# Patient Record
Sex: Female | Born: 1975 | Race: White | Hispanic: No | Marital: Married | Smoking: Never smoker
Health system: Southern US, Community
[De-identification: ages and names within clinical notes are randomized; demographics above are authoritative.]

## PROBLEM LIST (undated history)

## (undated) DIAGNOSIS — T4145XA Adverse effect of unspecified anesthetic, initial encounter: Secondary | ICD-10-CM

## (undated) DIAGNOSIS — Z8742 Personal history of other diseases of the female genital tract: Secondary | ICD-10-CM

## (undated) DIAGNOSIS — J329 Chronic sinusitis, unspecified: Secondary | ICD-10-CM

## (undated) DIAGNOSIS — Z87442 Personal history of urinary calculi: Secondary | ICD-10-CM

## (undated) DIAGNOSIS — T8859XA Other complications of anesthesia, initial encounter: Secondary | ICD-10-CM

## (undated) DIAGNOSIS — O24419 Gestational diabetes mellitus in pregnancy, unspecified control: Secondary | ICD-10-CM

## (undated) DIAGNOSIS — O09519 Supervision of elderly primigravida, unspecified trimester: Secondary | ICD-10-CM

## (undated) DIAGNOSIS — R519 Headache, unspecified: Secondary | ICD-10-CM

## (undated) DIAGNOSIS — R51 Headache: Secondary | ICD-10-CM

## (undated) DIAGNOSIS — R0981 Nasal congestion: Secondary | ICD-10-CM

## (undated) DIAGNOSIS — M2241 Chondromalacia patellae, right knee: Secondary | ICD-10-CM

## (undated) HISTORY — DX: Headache: R51

## (undated) HISTORY — DX: Supervision of elderly primigravida, unspecified trimester: O09.519

## (undated) HISTORY — DX: Personal history of urinary calculi: Z87.442

## (undated) HISTORY — DX: Personal history of other diseases of the female genital tract: Z87.42

## (undated) HISTORY — DX: Headache, unspecified: R51.9

---
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
## 2006-07-05 HISTORY — PX: SKIN GRAFT SPLIT THICKNESS LEG / FOOT: SUR1303

## 2008-06-08 ENCOUNTER — Emergency Department (HOSPITAL_COMMUNITY): Admission: EM | Admit: 2008-06-08 | Discharge: 2008-06-08 | Payer: Self-pay | Admitting: Family Medicine

## 2011-07-06 DIAGNOSIS — M2241 Chondromalacia patellae, right knee: Secondary | ICD-10-CM

## 2011-07-06 HISTORY — DX: Chondromalacia patellae, right knee: M22.41

## 2011-07-16 ENCOUNTER — Encounter (HOSPITAL_BASED_OUTPATIENT_CLINIC_OR_DEPARTMENT_OTHER): Payer: Self-pay | Admitting: *Deleted

## 2011-07-16 DIAGNOSIS — J329 Chronic sinusitis, unspecified: Secondary | ICD-10-CM

## 2011-07-16 DIAGNOSIS — R0981 Nasal congestion: Secondary | ICD-10-CM

## 2011-07-16 HISTORY — DX: Chronic sinusitis, unspecified: J32.9

## 2011-07-16 HISTORY — DX: Nasal congestion: R09.81

## 2011-07-19 NOTE — H&P (Signed)
Kimberly [name]/[facility] [address]   [address] [place], [place] [address] [phone] A Division of [facility]  Loreta Ave, M.D.     Robert A. Thurston Hole, M.D.     Lunette Stands, M.D. Eulas Post, M.D.    Buford Dresser, M.D. Estell Harpin, M.D. Ralene Cork, D.O.          Genene Churn. Barry Dienes, PA-C            Kirstin A. Shepperson, PA-C [place], OPA-C   RE: Kimberly, [name]                                [phone]      DOB: 1975-08-13 PROGRESS NOTE: 02-25-11 [name] is a pleasant 36 year-old female patient who comes in today complaining of right knee pain for the last two months.  She is a runner. She runs 1-2 miles anywhere from 2-5 times per week.  She denies any injury to her right knee.  She noticed that for the last two months she developed gradually lateral knee pain.  The pain has worsened in the last three weeks and becoming constant.  The pain is 3/10 in intensity.  Located in the lateral aspect of her knee.  No numbness or tingling radiating distally.  The pain gets worse with weight bearing activities and running.  Improved by resting.  She has tried some Motrin which has helped some, but hasn't taken complete care of her symptoms.  She never had pain like this before in her knee.  She never had an injury in her knees.  She did not change her training or increase her mileage.  Past medical history: Not significant. Current medications: None. Allergies: Hydrocodone. Family history: Negative. Social history: Does not smoke.  She works in [employer].     EXAMINATION: Well developed well nourished in no acute distress. Eyes: extraocular motion is intact. Lymph: no cervical or axillary lymph nodes present. Cardiovascular: no pedal edema. Respiratory: no increase in respiratory effort.  Psych: judgment and insight intact. Skin: No rashes or lesions Neuro: sensation is intact.  Musculoskeletal: Right knee with  intact skin.  No swelling.  No hematomas.  Full range of motion of flexion and extension.  Gait without a limp.  There is tenderness to palpation on the lateral retinaculum area lateral to the patellar tendon.  There is mild tenderness over the distal IT band.  Ober test is negative for IT band pain.  There is bilateral weakness of the abductor muscles in her hip.  Gait is without a limp.  Ligaments intact.  There is no patellofemoral crepitus.    X-RAYS: AP, lateral and sunrise views of her right knee is within normal limits.  No fractures.  No dislocations.  No calcification.    IMPRESSION: 1. Right knee pain. 2. Right knee iliotibial band syndrome.  3. Weakness of the hip abductors.    PLAN: We will start her on Mobic 15 mg p.o. daily.  We will refer her to physical therapy and recommend she receive iontophoresis.  She has worked with Vonna Kotyk in the past and she would like to work with him again.  We recommend she continue icing her knee.  We recommend she stops running until her symptoms have improved.  Physical therapy has a return to running program that we would like her to be involved with.  She will  contact us in 3-4 weeks.  If her symptoms are not improved by then we will consider proceeding with an MRI of her knee to rule out any soft tissue injury.    Nat Math, D.O.   Electronically verified by Nat Math, D.O. TRD(JR):jjh D 02-25-11 T 03-01-11  Yvetta Drotar/[facility] [address]   [address] [place], [place] [address] [phone] A Division of [facility]  Loreta Ave, M.D.     Robert A. Thurston Hole, M.D.     Lunette Stands, M.D. Eulas Post, M.D.    Buford Dresser, M.D. Estell Harpin, M.D. Ralene Cork, D.O.          Genene Churn. Barry Dienes, PA-C            Kirstin A. Shepperson, PA-C [facility], OPA-C   RE: Kimberly, [name]                                [phone]      DOB: Mar 19, 1976 PROGRESS  NOTE: 06-08-11 I had Kimberly [name] come in today to go over the dilemma with her knee, review her x-rays, her MRI scan, her symptoms and all possible treatment options.  She has now very longstanding lateral and anterior knee pain on the right.  This has been going off and on for at least six months.  Getting worse rather than better.  She is a very active runner.  Gradual onset of pain.  Not bad when she goes straight ahead, but any uneven ground, pivoting, squatting or activities that load the front of her knee cause significant symptoms.  She has been through a trial of rest, physical therapy and a course of anti-inflammatories without improvement.  She is getting worse rather than better.  Recent MRI complete.  I have gone over that report and scan and discussed it with her and her husband, who is with her.  She has very typical lateral patellofemoral compression with significant chondral changes lateral patella and even some subchondral changes there.  Lateral tilt.  Remaining structures of the knee look good.  No cartilage tears.  Ligaments intact.  Her symptoms have been very insidious in onsent.  I have also reviewed her x-rays that show nothing acute, but she does have lateral patellofemoral positioning.  Remaining history is reviewed, updated and included in the chart.      EXAMINATION: She has good motion of all joints both lower extremities.  Her Q angles are not excessive on either side, but on the right she has lateral tracking and much more lateral tethering than on the left.  Pain with patellar compression.  1-2+ crepitus, not extreme.  Ligaments stable.  Neurovascularly intact.  No meniscal signs.  Her opposite left knee does not have nearly as much tethering and certainly no pain with patellar compression.  Also her lateral tracking is more dramatic on the right than on the left.  Her VMO and quads really don't look bad and there is not a lot of atrophy.    DISPOSITION:  I have gone over her  diagnosis and treatment options with her in detail.  She and I both realize this is not improving on its own.  I don't think further efforts at conservative treatment, therapy, injection or medicines are going to help and she understands and agrees.  We discussed exam under anesthesia, arthroscopy, chondroplasty.  My hope is that  we can do a lateral release which will improve lateral compression and change the way her knee works so we can get much more long-term improvement.  Obviously I am not going to do a lateral release if she is not tethering.  We have talked a little bit about F[place]erson procedures as a backup plan in the future.  Given that her Q angle is not bad, I really doubt it is going to come to that.  All questions answered.  Procedures, risks, benefits and complications reviewed in detail.  Cautious outlook, but I really do feel this is the best approach to take.    Loreta Ave, M.D.   Electronically verified by Loreta Ave, M.D. DFM:jjh D 06-09-11 T 06-10-11

## 2011-07-22 ENCOUNTER — Ambulatory Visit (HOSPITAL_BASED_OUTPATIENT_CLINIC_OR_DEPARTMENT_OTHER)
Admission: RE | Admit: 2011-07-22 | Discharge: 2011-07-22 | Disposition: A | Discharge status: Home / Self Care | Payer: BC Managed Care – PPO | Source: Ambulatory Visit | Attending: Orthopedic Surgery | Admitting: Orthopedic Surgery

## 2011-07-22 ENCOUNTER — Encounter (HOSPITAL_BASED_OUTPATIENT_CLINIC_OR_DEPARTMENT_OTHER)
Admission: RE | Disposition: A | Discharge status: Home / Self Care | Payer: Self-pay | Source: Ambulatory Visit | Attending: Orthopedic Surgery

## 2011-07-22 ENCOUNTER — Encounter (HOSPITAL_BASED_OUTPATIENT_CLINIC_OR_DEPARTMENT_OTHER): Payer: Self-pay | Admitting: Anesthesiology

## 2011-07-22 ENCOUNTER — Ambulatory Visit (HOSPITAL_BASED_OUTPATIENT_CLINIC_OR_DEPARTMENT_OTHER): Payer: BC Managed Care – PPO | Admitting: Anesthesiology

## 2011-07-22 ENCOUNTER — Encounter (HOSPITAL_BASED_OUTPATIENT_CLINIC_OR_DEPARTMENT_OTHER): Payer: Self-pay

## 2011-07-22 DIAGNOSIS — M2340 Loose body in knee, unspecified knee: Secondary | ICD-10-CM | POA: Insufficient documentation

## 2011-07-22 DIAGNOSIS — Z4789 Encounter for other orthopedic aftercare: Secondary | ICD-10-CM

## 2011-07-22 DIAGNOSIS — M2240 Chondromalacia patellae, unspecified knee: Secondary | ICD-10-CM | POA: Insufficient documentation

## 2011-07-22 DIAGNOSIS — M2390 Unspecified internal derangement of unspecified knee: Secondary | ICD-10-CM | POA: Insufficient documentation

## 2011-07-22 HISTORY — DX: Chronic sinusitis, unspecified: J32.9

## 2011-07-22 HISTORY — DX: Nasal congestion: R09.81

## 2011-07-22 HISTORY — DX: Chondromalacia patellae, right knee: M22.41

## 2011-07-22 HISTORY — PX: KNEE ARTHROSCOPY: SHX127

## 2011-07-22 SURGERY — ARTHROSCOPY, KNEE
Anesthesia: General | Site: Knee | Laterality: Right | Wound class: Clean

## 2011-07-22 MED ORDER — MEPERIDINE HCL 25 MG/ML IJ SOLN: 6.2500 mg | INTRAMUSCULAR | Status: DC | PRN

## 2011-07-22 MED ORDER — FENTANYL CITRATE 0.05 MG/ML IJ SOLN
25.0000 ug | INTRAMUSCULAR | Status: DC | PRN
  Administered 2011-07-22 (×4): 25 ug via INTRAVENOUS

## 2011-07-22 MED ORDER — PROMETHAZINE HCL 25 MG/ML IJ SOLN
6.2500 mg | INTRAMUSCULAR | Status: DC | PRN
  Administered 2011-07-22: 12.5 mg via INTRAVENOUS

## 2011-07-22 MED ORDER — ACETAMINOPHEN 10 MG/ML IV SOLN
1000.0000 mg | Freq: Once | INTRAVENOUS | Status: AC
  Administered 2011-07-22: 1000 mg via INTRAVENOUS

## 2011-07-22 MED ORDER — CEFAZOLIN SODIUM 1-5 GM-% IV SOLN
1.0000 g | INTRAVENOUS | Status: AC
  Administered 2011-07-22: 1 g via INTRAVENOUS

## 2011-07-22 MED ORDER — DEXAMETHASONE SODIUM PHOSPHATE 4 MG/ML IJ SOLN
INTRAMUSCULAR | Status: DC | PRN
  Administered 2011-07-22: 10 mg via INTRAVENOUS

## 2011-07-22 MED ORDER — MIDAZOLAM HCL 5 MG/5ML IJ SOLN
INTRAMUSCULAR | Status: DC | PRN
  Administered 2011-07-22: 2 mg via INTRAVENOUS

## 2011-07-22 MED ORDER — MEPERIDINE HCL 50 MG PO TABS
50.0000 mg | ORAL_TABLET | Freq: Once | ORAL | Status: AC
  Administered 2011-07-22: 50 mg via ORAL

## 2011-07-22 MED ORDER — SODIUM CHLORIDE 0.9 % IR SOLN
Status: DC | PRN
  Administered 2011-07-22: 3000 mL

## 2011-07-22 MED ORDER — PROMETHAZINE HCL 12.5 MG PO TABS: 12.5000 mg | ORAL_TABLET | Freq: Once | ORAL | Status: DC

## 2011-07-22 MED ORDER — LIDOCAINE HCL (CARDIAC) 20 MG/ML IV SOLN
INTRAVENOUS | Status: DC | PRN
  Administered 2011-07-22: 60 mg via INTRAVENOUS

## 2011-07-22 MED ORDER — PROPOFOL 10 MG/ML IV EMUL
INTRAVENOUS | Status: DC | PRN
  Administered 2011-07-22: 150 mg via INTRAVENOUS

## 2011-07-22 MED ORDER — BUPIVACAINE HCL (PF) 0.25 % IJ SOLN
INTRAMUSCULAR | Status: DC | PRN
  Administered 2011-07-22: 30 mL via INTRA_ARTICULAR

## 2011-07-22 MED ORDER — FENTANYL CITRATE 0.05 MG/ML IJ SOLN
INTRAMUSCULAR | Status: DC | PRN
  Administered 2011-07-22 (×2): 50 ug via INTRAVENOUS
  Administered 2011-07-22: 100 ug via INTRAVENOUS

## 2011-07-22 MED ORDER — LACTATED RINGERS IV SOLN
INTRAVENOUS | Status: DC
  Administered 2011-07-22 (×2): via INTRAVENOUS

## 2011-07-22 MED ORDER — MORPHINE SULFATE 4 MG/ML IJ SOLN
INTRAMUSCULAR | Status: DC | PRN
  Administered 2011-07-22: 4 mg via INTRAVENOUS

## 2011-07-22 MED ORDER — ONDANSETRON HCL 4 MG/2ML IJ SOLN
INTRAMUSCULAR | Status: DC | PRN
  Administered 2011-07-22: 4 mg via INTRAVENOUS

## 2011-07-22 SURGICAL SUPPLY — 37 items
BANDAGE ELASTIC 6 VELCRO ST LF (GAUZE/BANDAGES/DRESSINGS) ×2 IMPLANT
BLADE CUDA 5.5 (BLADE) IMPLANT
BLADE CUDA GRT WHITE 3.5 (BLADE) IMPLANT
BLADE CUTTER GATOR 3.5 (BLADE) ×2 IMPLANT
BLADE CUTTER MENIS 5.5 (BLADE) IMPLANT
BLADE GREAT WHITE 4.2 (BLADE) ×2 IMPLANT
BUR OVAL 4.0 (BURR) IMPLANT
CANISTER OMNI JUG 16 LITER (MISCELLANEOUS) ×2 IMPLANT
CANISTER SUCTION 2500CC (MISCELLANEOUS) IMPLANT
CLOTH BEACON ORANGE TIMEOUT ST (SAFETY) ×2 IMPLANT
CUTTER MENISCUS  4.2MM (BLADE)
CUTTER MENISCUS 4.2MM (BLADE) IMPLANT
DRAPE ARTHROSCOPY W/POUCH 90 (DRAPES) ×2 IMPLANT
DRSG PAD ABDOMINAL 8X10 ST (GAUZE/BANDAGES/DRESSINGS) ×2 IMPLANT
DURAPREP 26ML APPLICATOR (WOUND CARE) ×2 IMPLANT
ELECT MENISCUS 165MM 90D (ELECTRODE) ×2 IMPLANT
ELECT REM PT RETURN 9FT ADLT (ELECTROSURGICAL) ×2
ELECTRODE REM PT RTRN 9FT ADLT (ELECTROSURGICAL) ×1 IMPLANT
GAUZE XEROFORM 1X8 LF (GAUZE/BANDAGES/DRESSINGS) ×2 IMPLANT
GLOVE BIOGEL PI IND STRL 8 (GLOVE) ×1 IMPLANT
GLOVE BIOGEL PI INDICATOR 8 (GLOVE) ×1
GLOVE ORTHO TXT STRL SZ7.5 (GLOVE) ×4 IMPLANT
GOWN BRE IMP PREV XXLGXLNG (GOWN DISPOSABLE) ×2 IMPLANT
GOWN PREVENTION PLUS XLARGE (GOWN DISPOSABLE) ×4 IMPLANT
HOLDER KNEE FOAM BLUE (MISCELLANEOUS) ×2 IMPLANT
IMMOBILIZER KNEE 22 UNIV (SOFTGOODS) ×2 IMPLANT
KNEE WRAP E Z 3 GEL PACK (MISCELLANEOUS) ×2 IMPLANT
PACK ARTHROSCOPY DSU (CUSTOM PROCEDURE TRAY) ×2 IMPLANT
PACK BASIN DAY SURGERY FS (CUSTOM PROCEDURE TRAY) ×2 IMPLANT
PENCIL BUTTON HOLSTER BLD 10FT (ELECTRODE) ×2 IMPLANT
SET ARTHROSCOPY TUBING (MISCELLANEOUS) ×1
SET ARTHROSCOPY TUBING LN (MISCELLANEOUS) ×1 IMPLANT
SPONGE GAUZE 4X4 12PLY (GAUZE/BANDAGES/DRESSINGS) ×2 IMPLANT
SUT ETHILON 3 0 PS 1 (SUTURE) ×2 IMPLANT
SUT VIC AB 3-0 FS2 27 (SUTURE) IMPLANT
TOWEL OR 17X24 6PK STRL BLUE (TOWEL DISPOSABLE) ×2 IMPLANT
WATER STERILE IRR 1000ML POUR (IV SOLUTION) ×2 IMPLANT

## 2011-07-22 NOTE — Interval H&P Note (Signed)
History and Physical Interval Note:  07/22/2011 7:35 AM  Kimberly [name]  has presented today for surgery, with the diagnosis of chondromalacia right patella  The various methods of treatment have been discussed with the patient and family. After consideration of risks, benefits and other options for treatment, the patient has consented to  Procedure(s): ARTHROSCOPY KNEE as a surgical intervention .  The patients' history has been reviewed, patient examined, no change in status, stable for surgery.  I have reviewed the patients' chart and labs.  Questions were answered to the patient's satisfaction.     MURPHY,DANIEL F

## 2011-07-22 NOTE — Anesthesia Postprocedure Evaluation (Signed)
  Anesthesia Post-op Note  Patient: Kimberly [name]  Procedure(s) Performed:  ARTHROSCOPY KNEE - with lateral release and with debridement/shaving (chondroplasty)  Patient Location: PACU  Anesthesia Type: General  Level of Consciousness: awake and alert   Airway and Oxygen Therapy: Patient Spontanous Breathing and Patient connected to face mask oxygen  Post-op Pain: mild  Post-op Assessment: Post-op Vital signs reviewed, Patient's Cardiovascular Status Stable, Respiratory Function Stable and Patent Airway  Post-op Vital Signs: Reviewed and stable  Complications: No apparent anesthesia complications

## 2011-07-22 NOTE — Anesthesia Procedure Notes (Signed)
Procedure Name: LMA Insertion Date/Time: 07/22/2011 10:10 AM Performed by: Signa Kell Pre-anesthesia Checklist: Patient identified, Emergency Drugs available, Suction available and Patient being monitored Patient Re-evaluated:Patient Re-evaluated prior to inductionOxygen Delivery Method: Circle System Utilized Preoxygenation: Pre-oxygenation with 100% oxygen Intubation Type: IV induction Ventilation: Mask ventilation without difficulty LMA: LMA inserted LMA Size: 4.0 Number of attempts: 1 Airway Equipment and Method: bite block Placement Confirmation: positive ETCO2 Tube secured with: Tape Dental Injury: Teeth and Oropharynx as per pre-operative assessment

## 2011-07-22 NOTE — Anesthesia Preprocedure Evaluation (Signed)
Anesthesia Evaluation  Patient identified by MRN, date of birth, ID band Patient awake    Reviewed: Allergy & Precautions, H&P , NPO status , Patient's Chart, lab work & pertinent test results  Airway Mallampati: I TM Distance: >3 FB Neck ROM: Full    Dental No notable dental hx. (+) Teeth Intact   Pulmonary neg pulmonary ROS,  clear to auscultation  Pulmonary exam normal       Cardiovascular neg cardio ROS Regular Normal    Neuro/Psych Negative Neurological ROS  Negative Psych ROS   GI/Hepatic negative GI ROS, Neg liver ROS,   Endo/Other  Negative Endocrine ROS  Renal/GU negative Renal ROS  Genitourinary negative   Musculoskeletal   Abdominal   Peds  Hematology negative hematology ROS (+)   Anesthesia Other Findings   Reproductive/Obstetrics negative OB ROS                           Anesthesia Physical Anesthesia Plan  ASA: I  Anesthesia Plan: General   Post-op Pain Management:    Induction: Intravenous  Airway Management Planned: LMA  Additional Equipment:   Intra-op Plan:   Post-operative Plan: Extubation in OR  Informed Consent: I have reviewed the patients History and Physical, chart, labs and discussed the procedure including the risks, benefits and alternatives for the proposed anesthesia with the patient or authorized representative who has indicated his/her understanding and acceptance.     Plan Discussed with: CRNA  Anesthesia Plan Comments:         Anesthesia Quick Evaluation  

## 2011-07-22 NOTE — Transfer of Care (Signed)
Immediate Anesthesia Transfer of Care Note  Patient: Kimberly [name]  Procedure(s) Performed:  ARTHROSCOPY KNEE - with lateral release and with debridement/shaving (chondroplasty)  Patient Location: PACU  Anesthesia Type: General  Level of Consciousness: sedated  Airway & Oxygen Therapy: Patient Spontanous Breathing and Patient connected to face mask oxygen  Post-op Assessment: Report given to PACU RN and Post -op Vital signs reviewed and stable  Post vital signs: Reviewed and stable Filed Vitals:   07/22/11 0932  BP: 150/86  Pulse: 74  Temp: 36.9 C  Resp: 20    Complications: No apparent anesthesia complications

## 2011-07-22 NOTE — Brief Op Note (Signed)
07/22/2011  11:17 AM  PATIENT:  Kimberly [name]  36 y.o. female  PRE-OPERATIVE DIAGNOSIS:  Chondromalacia Right Patella  POST-OPERATIVE DIAGNOSIS:  Chondromalacia Right Patella  PROCEDURE:  Procedure(s): rightARTHROSCOPY KNEE, chondroplasty, lateral release  SURGEON:  Surgeon(s): Loreta Ave, MD  PHYSICIAN ASSISTANT: Zonia Kief M    ANESTHESIA:   general  EBL:  Total I/O In: 1000 [I.V.:1000] Out: -    SPECIMEN:  No Specimen  DISPOSITION OF SPECIMEN:  N/A  TOURNIQUET:   Total Tourniquet Time Documented: Thigh (Right) - 11 minutes   PATIENT DISPOSITION:  PACU - hemodynamically stable.

## 2011-07-23 ENCOUNTER — Encounter (HOSPITAL_BASED_OUTPATIENT_CLINIC_OR_DEPARTMENT_OTHER): Payer: Self-pay | Admitting: Orthopedic Surgery

## 2011-07-23 NOTE — Op Note (Signed)
NAME:  Kimberly [name] NO.:  MEDICAL RECORD NO.:  [id number]  LOCATION:                                 FACILITY:  PHYSICIAN:  Loreta Ave, M.D.      DATE OF BIRTH:  DATE OF PROCEDURE: DATE OF DISCHARGE:                              OPERATIVE REPORT   PREOPERATIVE DIAGNOSIS:  Right knee chondromalacia patella with lateral tracking and tethering.  POSTOPERATIVE DIAGNOSIS:  Right knee chondromalacia patella with lateral tracking and tethering with some grade 2 chondromalacia, lateral patella, as well as small area on the weightbearing dome, medial femoral condyle.  PROCEDURE:  Right knee exam under anesthesia, arthroscopy. Chondroplasty medial femoral condyle patella.  Arthroscopic lateral retinacular release.  SURGEON:  Loreta Ave, MD  ASSISTANT:  Genene Churn. Barry Dienes, [place]  ANESTHESIA:  General.  BLOOD LOSS:  Minimal.  SPECIMENS:  None.  COUNTS:  None.  COMPLICATIONS:  None.  DRESSING:  Soft compressive with lateral bolster.  TOURNIQUET TIME:  Fifteen minutes.  PROCEDURE IN DETAIL:  The patient was brought to the operating room, placed on the operating table in supine position.  After adequate anesthesia had been obtained, both knees examined.  Both have lateral tracking, more tethering on the right than on the left.  Stable ligaments.  Tourniquet applied.  Leg holder applied.  The leg prepped and draped in usual sterile fashion.  Two portals, one each medial and lateral parapatellar.  Arthroscope was induced, knee distended and inspected.  Chondroplasty, lateral patella, and medial femoral condyle. Removal of chondral loose bodies.  Menisci intact.  Cruciate ligaments intact.  I looked to tracking from all angles.  She does not have to alter, the trochlea is reasonable, little shallow, but not excessively. After assessing tracking and tethering, I thought it was sufficient to warrant lateral release.  We inflated with the tourniquet  15 minutes during this procedure.  With cautery, the lateral retinaculum release from the VMO superiorly down the lateral joint line inferiorly. Hemostasis with cautery.  Reassessed tracking and tethering had markedly improved after lateral retinacular release.  Instruments were removed.  Portals were closed with nylon.  Tourniquet deflated.  Sterile compressive dressing applied with a lateral bolster.  Anesthesia reversed.  Brought to recovery room. Tolerated surgery well.  No complications.     Loreta Ave, M.D.     DFM/MEDQ  D:  07/22/2011  T:  07/23/2011  Job:  [id number]

## 2011-07-23 NOTE — Op Note (Signed)
NAME:  Kimberly [name], Kimberly [name]                     ACCOUNT NO.:  MEDICAL RECORD NO.:  [id number]  LOCATION:                                 FACILITY:  PHYSICIAN:  Loreta Ave, M.D.      DATE OF BIRTH:  DATE OF PROCEDURE:  07/22/2011 DATE OF DISCHARGE:                              OPERATIVE REPORT   PREOPERATIVE DIAGNOSES: 1. Right knee chondromalacia patella. 2. Lateral patellofemoral tracking and tethering.  POSTOPERATIVE DIAGNOSES: 1. Right knee chondromalacia patella. 2. Lateral patellofemoral tracking and tethering, with grade 2 changes     lateral patella and some focal grade 2 changes medial femoral     condyle, weightbearing dome.  PROCEDURES: 1. Right knee exam under anesthesia, arthroscopy. 2. Chondroplasty patella medial femoral condyle. 3. Removal of chondral loose bodies. 4. Arthroscopic lateral retinacular release.  SURGEON:  Loreta Ave, MD  ASSISTANT:  Genene Churn. Barry Dienes, PA  ANESTHESIA:  General.  ESTIMATED BLOOD LOSS:  Minimal.  SPECIMENS:  None.  COUNTS:  None.  COMPLICATIONS:  None.  DRESSING:  Soft compressive with lateral bolster.  TOURNIQUET TIME:  15 minutes.  DESCRIPTION OF PROCEDURE:  The patient was brought to the operating room, placed on the operating table in supine position.  After adequate anesthesia had been obtained, knee examined.  Lateral tracking both sides.  More tethering on the symptomatic right and left.  Stable ligaments.  Tourniquet leg holder applied.  Leg was prepped and draped in usual sterile fashion.  Two portals, 1 each medial and lateral parapatellar.  Arthroscope introduced.  Knee distended and inspected. Lateral tracking tethering confirmed.  Chondroplasty lateral patella and a 1-cm focal grade 2 area weightbearing dome medial femoral condyle. Some chondral debris loose bodies removed throughout the knee.  Tracking then assessed from all angles.  Depth only enough tethering to warrant lateral release.  Both  menisci intact.  Cruciate ligaments intact. Tourniquet inflated for lateral release.  With cautery, lateral retinaculum released from the vastus lateralis superiorly all the way down the lateral joint line inferiorly.  Hemostasis with cautery. Tracking assessed after release.  Markedly improved obliteration of tethering and nice tracking throughout.  Instruments were removed. Portals closed with nylon.  Sterile compressive dressing applied. Tourniquet deflated.  Anesthesia reversed.  Brought to recovery room. Tolerated surgery well.  No complications.     Loreta Ave, M.D.     DFM/MEDQ  D:  07/22/2011  T:  07/23/2011  Job:  [id number]

## 2011-11-11 ENCOUNTER — Ambulatory Visit (INDEPENDENT_AMBULATORY_CARE_PROVIDER_SITE_OTHER): Payer: BC Managed Care – PPO | Admitting: Physician Assistant

## 2011-11-11 VITALS — BP 111/74 | HR 75 | Temp 98.3°F | Resp 14 | Ht 63.0 in | Wt 132.8 lb

## 2011-11-11 DIAGNOSIS — R1011 Right upper quadrant pain: Secondary | ICD-10-CM

## 2011-11-11 DIAGNOSIS — R109 Unspecified abdominal pain: Secondary | ICD-10-CM

## 2011-11-11 DIAGNOSIS — M546 Pain in thoracic spine: Secondary | ICD-10-CM

## 2011-11-11 LAB — POCT CBC
Granulocyte percent: 50.8 %G (ref 37–80)
HCT, POC: 43 % (ref 37.7–47.9)
Hemoglobin: 13.9 g/dL (ref 12.2–16.2)
Lymph, poc: 3.2 (ref 0.6–3.4)
MCHC: 32.3 g/dL (ref 31.8–35.4)
POC Granulocyte: 3.9 (ref 2–6.9)
RBC: 4.42 M/uL (ref 4.04–5.48)

## 2011-11-11 LAB — POCT UA - MICROSCOPIC ONLY: Mucus, UA: NEGATIVE

## 2011-11-11 LAB — POCT URINALYSIS DIPSTICK
Bilirubin, UA: NEGATIVE
Ketones, UA: NEGATIVE
Spec Grav, UA: 1.015
pH, UA: 7

## 2011-11-11 MED ORDER — ONDANSETRON HCL 4 MG PO TABS: 4.0000 mg | ORAL_TABLET | Freq: Three times a day (TID) | ORAL | Status: AC | PRN

## 2011-11-11 MED ORDER — NAPROXEN 500 MG PO TABS: ORAL_TABLET | ORAL | Status: DC

## 2011-11-11 MED ORDER — METAXALONE 800 MG PO TABS: 800.0000 mg | ORAL_TABLET | Freq: Three times a day (TID) | ORAL | Status: AC

## 2011-11-11 NOTE — Patient Instructions (Signed)
Drink plenty of fluids, rest.  Recheck in 48 hrs if not improving, sooner if worse

## 2011-11-11 NOTE — Progress Notes (Signed)
Subjective:    Patient ID: Kimberly [name], female    DOB: October 23, 1975, 36 y.o.   MRN: [id number]  HPI 36 yo CF c/o acute onset upper R back and side pain that began while she was laying in bed about 7pm. She has had some abdominal cramping. LMP last week. No vaginal discharge, BMs are normal.  No f/c.  Denies urinary s/sx. Also having continuous nausea without vomiting.  Decreased appetite. Describes pain in back as a deep discomfort that is there constantly but it waxes and wanes in intensity.  No paresthesias.  Review of Systems  All other systems reviewed and are negative.       Objective:   Physical Exam  Nursing note and vitals reviewed. Constitutional: She is oriented to person, place, and time. She appears well-developed and well-nourished.  HENT:  Head: Normocephalic and atraumatic.  Mouth/Throat: Oropharynx is clear and moist. No oropharyngeal exudate.  Neck: Normal range of motion. Neck supple.  Cardiovascular: Normal rate, regular rhythm and normal heart sounds.   Pulmonary/Chest: Effort normal and breath sounds normal.  Abdominal: Soft. Bowel sounds are normal. She exhibits no distension and no mass. There is no tenderness. There is no rebound and no guarding.       Neg McBurney's.  Neg Murphy's.  No peritoneal signs.  Musculoskeletal: Normal range of motion. She exhibits tenderness. She exhibits no edema.       Arms: Neurological: She is alert and oriented to person, place, and time. No cranial nerve deficit.  Skin: Skin is warm and dry.  Psychiatric: She has a normal mood and affect. Her behavior is normal.   Results for orders placed in visit on 11/11/11  POCT CBC      Component Value Range   WBC 7.7  4.6 - 10.2 (K/uL)   Lymph, poc 3.2  0.6 - 3.4    POC LYMPH PERCENT 41.6  10 - 50 (%L)   MID (cbc) 0.6  0 - 0.9    POC MID % 7.6  0 - 12 (%M)   POC Granulocyte 3.9  2 - 6.9    Granulocyte percent 50.8  37 - 80 (%G)   RBC 4.42  4.04 - 5.48 (M/uL)   Hemoglobin 13.9   12.2 - 16.2 (g/dL)   HCT, POC 69.6  29.5 - 47.9 (%)   MCV 97.2 (*) 80 - 97 (fL)   MCH, POC 31.4 (*) 27 - 31.2 (pg)   MCHC 32.3  31.8 - 35.4 (g/dL)   RDW, POC 28.4     Platelet Count, POC 290  142 - 424 (K/uL)   MPV 9.2  0 - 99.8 (fL)  POCT UA - MICROSCOPIC ONLY      Component Value Range   WBC, Ur, HPF, POC 2-4     RBC, urine, microscopic 1-3     Bacteria, U Microscopic trace     Mucus, UA neg     Epithelial cells, urine per micros 1-3     Crystals, Ur, HPF, POC neg     Casts, Ur, LPF, POC neg     Yeast, UA neg    POCT URINALYSIS DIPSTICK      Component Value Range   Color, UA yellow     Clarity, UA clear     Glucose, UA neg     Bilirubin, UA neg     Ketones, UA neg     Spec Grav, UA 1.015     Blood, UA small  pH, UA 7.0     Protein, UA neg     Urobilinogen, UA 0.2     Nitrite, UA neg     Leukocytes, UA Negative       12:35pm zofran ODT 4mg  given p.o.     Assessment & Plan:  Nausea without vomiting-viral syndrome.  Nausea has resolved upon dismissal.  R upper lateral back pain-believe musculoskeletal.  Check CMET.  ?early nephrolithiasis although area of pain is more superior than the kidneys should be.

## 2011-11-12 LAB — COMPREHENSIVE METABOLIC PANEL
Albumin: 4.5 g/dL (ref 3.5–5.2)
BUN: 12 mg/dL (ref 6–23)
CO2: 24 mEq/L (ref 19–32)
Calcium: 9.8 mg/dL (ref 8.4–10.5)
Chloride: 104 mEq/L (ref 96–112)
Creat: 0.74 mg/dL (ref 0.50–1.10)
Potassium: 4.3 mEq/L (ref 3.5–5.3)

## 2012-05-03 ENCOUNTER — Other Ambulatory Visit: Payer: Self-pay | Admitting: Obstetrics and Gynecology

## 2012-05-03 DIAGNOSIS — R928 Other abnormal and inconclusive findings on diagnostic imaging of breast: Secondary | ICD-10-CM

## 2012-05-11 ENCOUNTER — Ambulatory Visit
Admission: RE | Admit: 2012-05-11 | Discharge: 2012-05-11 | Disposition: A | Discharge status: Home / Self Care | Payer: BC Managed Care – PPO | Source: Ambulatory Visit | Attending: Obstetrics and Gynecology | Admitting: Obstetrics and Gynecology

## 2012-05-11 DIAGNOSIS — R928 Other abnormal and inconclusive findings on diagnostic imaging of breast: Secondary | ICD-10-CM

## 2014-07-05 HISTORY — PX: OTHER SURGICAL HISTORY: SHX169

## 2015-12-31 LAB — OB RESULTS CONSOLE GC/CHLAMYDIA
Chlamydia: NEGATIVE
GC PROBE AMP, GENITAL: NEGATIVE

## 2015-12-31 LAB — OB RESULTS CONSOLE HIV ANTIBODY (ROUTINE TESTING): HIV: NONREACTIVE

## 2015-12-31 LAB — OB RESULTS CONSOLE ABO/RH: RH TYPE: POSITIVE

## 2015-12-31 LAB — OB RESULTS CONSOLE ANTIBODY SCREEN: Antibody Screen: NEGATIVE

## 2015-12-31 LAB — OB RESULTS CONSOLE RUBELLA ANTIBODY, IGM: Rubella: IMMUNE

## 2015-12-31 LAB — OB RESULTS CONSOLE RPR: RPR: NONREACTIVE

## 2015-12-31 LAB — OB RESULTS CONSOLE HEPATITIS B SURFACE ANTIGEN: Hepatitis B Surface Ag: NEGATIVE

## 2016-01-07 ENCOUNTER — Encounter: Payer: Self-pay | Admitting: Cardiology

## 2016-01-13 NOTE — Progress Notes (Signed)
Electrophysiology Office Note   Date:  01/14/2016   ID:  Kimberly [name], DOB 1975/08/29, MRN [id number]  PCP:  Abner Greenspan, MD  Primary Electrophysiologist:  Regan Lemming, MD    Chief Complaint  Patient presents with  . New Patient (Initial Visit)  . Chest Pain     History of Present Illness: Kimberly [name] is a 40 y.o. female who presents today for electrophysiology evaluation.   She was seen by her obstetrician, she is [redacted] weeks pregnant, who found that she was having intermittent premature beats. She is an avid runner. She was encouraged not to have caffeine.  She says that over the last few weeks, she has been feeling very poorly with increasing palpitations. She says that's her palpitations occur when she is both at rest and exerting herself. She is usually an avid runner and she has not been running over this period of time. She has been getting some shortness of breath, but does not endorse PND or orthopnea. She says that she wears a heart rate monitor and at times her heart rate gets into the 140s and down into the 50s as well. She says that she can feel a skipped beat a cause and then a very forceful beat.  Today, she denies symptoms of chest pain, orthopnea, PND, lower extremity edema, claudication, dizziness, presyncope, syncope, bleeding, or neurologic sequela. The patient is tolerating medications without difficulties and is otherwise without complaint today.    Past Medical History  Diagnosis Date  . Sinus infection 07/16/2011    will finish antibiotic 07/18/2011  . 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 Nasal congestion 07/16/2011   Past Surgical History  Procedure Laterality Date  . Skin graft split thickness leg / foot  2008    right foot  . Knee arthroscopy  07/22/2011    Procedure: ARTHROSCOPY KNEE;  Surgeon: Loreta Ave, MD;  Location: [facility];  Service: Orthopedics;  Laterality: Right;  with lateral release and with  debridement/shaving (chondroplasty)     Current Outpatient Prescriptions  Medication Sig Dispense Refill  . DICLEGIS 10-10 MG TBEC Take 2 tablets by mouth 2 (two) times daily at 8 am and 10 pm.  2   No current facility-administered medications for this visit.    Allergies:   Hydrocodone   Social History:  The patient  reports that she has never smoked. She has never used smokeless tobacco. She reports that she drinks alcohol. She reports that she does not use illicit drugs.   Family History:  The patient's family history includes Diabetes in her maternal grandmother; Heart attack in her paternal grandfather; Heart disease in her father; Lung cancer in her maternal grandfather; Thyroid disease in her maternal grandmother.    ROS:  Please see the history of present illness.   Otherwise, review of systems is positive for DOE, palpitations, nausea.   All other systems are reviewed and negative.    PHYSICAL EXAM: VS:  BP 116/78 mmHg  Pulse 73  Ht  (1.6 m)  Wt 144 lb (65.318 kg)  BMI 25.51 kg/m2 , BMI Body mass index is 25.51 kg/(m^2). GEN: Well nourished, well developed, in no acute distress HEENT: normal Neck: no JVD, carotid bruits, or masses Cardiac: RRR; no murmurs, rubs, or gallops,no edema  Respiratory:  clear to auscultation bilaterally, normal work of breathing GI: soft, nontender, nondistended, + BS MS: no deformity or atrophy Skin: warm and dry Neuro:  Strength and sensation are intact Psych: euthymic  mood, full affect  EKG:  EKG is ordered today. The ekg ordered today shows sinus rhythm, rate 73, occasional PVCs  Recent Labs: No results found for requested labs within last 365 days.    Lipid Panel  No results found for: CHOL, TRIG, HDL, CHOLHDL, VLDL, LDLCALC, LDLDIRECT   Wt Readings from Last 3 Encounters:  01/14/16 144 lb (65.318 kg)  11/11/11 132 lb 12.8 oz (60.238 kg)  07/16/11 125 lb (56.7 kg)      Other studies Reviewed: Additional studies/  records that were reviewed today include: OB/GYN notes   ASSESSMENT AND PLAN:  1.  Palpitations:  she is having PVCs on her EKG which could explain the skipped beats and forceful beats. That being said, she does wear a heart rate monitor her heart rate is gone into the 140s. Due to that, we'll plan for a 48 hour monitor, as she does have symptoms daily. It is possible that her shortness of breath is caused by arrhythmias. She is pregnant, and if anything is found pregnancy may make therapy more difficult.     Current medicines are reviewed at length with the patient today.   The patient does not have concerns regarding her medicines.  The following changes were made today:  none  Labs/ tests ordered today include:  Orders Placed This Encounter  Procedures  . Holter monitor - 48 hour  . EKG 12-Lead     Disposition:   FU with Will Camnitz post monitor  Signed, Will Jorja LoaMartin Camnitz, MD  01/14/2016 3:28 PM     [facility] [address] [place] [address] [phone] (office) [phone] (fax)

## 2016-01-14 ENCOUNTER — Encounter (INDEPENDENT_AMBULATORY_CARE_PROVIDER_SITE_OTHER): Payer: Self-pay

## 2016-01-14 ENCOUNTER — Encounter: Payer: Self-pay | Admitting: Cardiology

## 2016-01-14 ENCOUNTER — Ambulatory Visit (INDEPENDENT_AMBULATORY_CARE_PROVIDER_SITE_OTHER): Payer: BC Managed Care – PPO | Admitting: Cardiology

## 2016-01-14 VITALS — BP 116/78 | HR 73 | Ht 63.0 in | Wt 144.0 lb

## 2016-01-14 DIAGNOSIS — R002 Palpitations: Secondary | ICD-10-CM | POA: Diagnosis not present

## 2016-01-14 NOTE — Patient Instructions (Signed)
Medication Instructions:  Your physician recommends that you continue on your current medications as directed. Please refer to the Current Medication list given to you today.  Labwork: None ordered  Testing/Procedures: Your physician has recommended that you wear a holter monitor. Holter monitors are medical devices that record the heart's electrical activity. Doctors most often use these monitors to diagnose arrhythmias. Arrhythmias are problems with the speed or rhythm of the heartbeat. The monitor is a small, portable device. You can wear one while you do your normal daily activities. This is usually used to diagnose what is causing palpitations/syncope (passing out).  Follow-Up: To be determined after holter monitoring is completed and reviewed by the physician.  We will call you with these results.  If you need a refill on your cardiac medications before your next appointment, please call your pharmacy.  Thank you for choosing [facility]!!   Dory HornSherri Hipolito Martinezlopez, RN [phone]  Any Other Special Instructions Will Be Listed Below (If Applicable).  Holter Monitoring A Holter monitor is a small device that is used to detect abnormal heart rhythms. It clips to your clothing and is connected by wires to flat, sticky disks (electrodes) that attach to your chest. It is worn continuously for 24-48 hours. HOME CARE INSTRUCTIONS  Wear your Holter monitor at all times, even while exercising and sleeping, for as long as directed by your health care provider.  Make sure that the Holter monitor is safely clipped to your clothing or close to your body as recommended by your health care provider.  Do not get the monitor or wires wet.  Do not put body lotion or moisturizer on your chest.  Keep your skin clean.  Keep a diary of your daily activities, such as walking and doing chores. If you feel that your heartbeat is abnormal or that your heart is fluttering or skipping a beat:  Record what  you are doing when it happens.  Record what time of day the symptoms occur.  Return your Holter monitor as directed by your health care provider.  Keep all follow-up visits as directed by your health care provider. This is important. SEEK IMMEDIATE MEDICAL CARE IF:  You feel lightheaded or you faint.  You have trouble breathing.  You feel pain in your chest, upper arm, or jaw.  You feel sick to your stomach and your skin is pale, cool, or damp.  You heartbeat feels unusual or abnormal.   This information is not intended to replace advice given to you by your health care provider. Make sure you discuss any questions you have with your health care provider.   Document Released: 03/19/2004 Document Revised: 07/12/2014 Document Reviewed: 01/28/2014 Elsevier Interactive Patient Education [employer]2016 Elsevier Inc.

## 2016-01-16 ENCOUNTER — Ambulatory Visit (INDEPENDENT_AMBULATORY_CARE_PROVIDER_SITE_OTHER): Payer: BC Managed Care – PPO

## 2016-01-16 DIAGNOSIS — R002 Palpitations: Secondary | ICD-10-CM

## 2016-01-21 ENCOUNTER — Ambulatory Visit: Payer: BC Managed Care – PPO | Admitting: Cardiology

## 2016-01-28 ENCOUNTER — Encounter: Payer: Self-pay | Admitting: Cardiology

## 2016-01-28 NOTE — Telephone Encounter (Signed)
New message   Pt verbalized that she is calling for the monitor results

## 2016-01-28 NOTE — Telephone Encounter (Signed)
This encounter was created in error - please disregard.

## 2016-02-19 ENCOUNTER — Ambulatory Visit: Payer: BC Managed Care – PPO | Admitting: Cardiology

## 2016-03-03 ENCOUNTER — Ambulatory Visit: Payer: BC Managed Care – PPO | Admitting: Cardiology

## 2016-06-25 LAB — OB RESULTS CONSOLE GBS: STREP GROUP B AG: POSITIVE

## 2016-07-16 ENCOUNTER — Inpatient Hospital Stay (HOSPITAL_COMMUNITY)
Admission: AD | Admit: 2016-07-16 | Payer: BC Managed Care – PPO | Source: Ambulatory Visit | Admitting: Obstetrics and Gynecology

## 2016-07-26 ENCOUNTER — Telehealth (HOSPITAL_COMMUNITY): Payer: Self-pay | Admitting: *Deleted

## 2016-07-27 ENCOUNTER — Encounter (HOSPITAL_COMMUNITY): Payer: Self-pay | Admitting: *Deleted

## 2016-07-27 NOTE — Telephone Encounter (Signed)
Preadmission screen  

## 2016-08-02 ENCOUNTER — Inpatient Hospital Stay (HOSPITAL_COMMUNITY): Payer: BC Managed Care – PPO | Admitting: Anesthesiology

## 2016-08-02 ENCOUNTER — Encounter (HOSPITAL_COMMUNITY): Payer: Self-pay

## 2016-08-02 ENCOUNTER — Encounter (HOSPITAL_COMMUNITY)
Admission: AD | Disposition: A | Discharge status: Home / Self Care | Payer: Self-pay | Source: Ambulatory Visit | Attending: Obstetrics and Gynecology

## 2016-08-02 ENCOUNTER — Inpatient Hospital Stay (HOSPITAL_COMMUNITY)
Admission: AD | Admit: 2016-08-02 | Discharge: 2016-08-05 | DRG: 765 | Disposition: A | Discharge status: Home / Self Care | Payer: BC Managed Care – PPO | Source: Ambulatory Visit | Attending: Obstetrics and Gynecology | Admitting: Obstetrics and Gynecology

## 2016-08-02 DIAGNOSIS — O9089 Other complications of the puerperium, not elsewhere classified: Secondary | ICD-10-CM | POA: Diagnosis present

## 2016-08-02 DIAGNOSIS — Z833 Family history of diabetes mellitus: Secondary | ICD-10-CM

## 2016-08-02 DIAGNOSIS — R441 Visual hallucinations: Secondary | ICD-10-CM | POA: Diagnosis present

## 2016-08-02 DIAGNOSIS — Z3493 Encounter for supervision of normal pregnancy, unspecified, third trimester: Secondary | ICD-10-CM | POA: Diagnosis present

## 2016-08-02 DIAGNOSIS — O9953 Diseases of the respiratory system complicating the puerperium: Secondary | ICD-10-CM | POA: Diagnosis not present

## 2016-08-02 DIAGNOSIS — Z3490 Encounter for supervision of normal pregnancy, unspecified, unspecified trimester: Secondary | ICD-10-CM

## 2016-08-02 DIAGNOSIS — Z8249 Family history of ischemic heart disease and other diseases of the circulatory system: Secondary | ICD-10-CM | POA: Diagnosis not present

## 2016-08-02 DIAGNOSIS — J9589 Other postprocedural complications and disorders of respiratory system, not elsewhere classified: Secondary | ICD-10-CM | POA: Diagnosis not present

## 2016-08-02 DIAGNOSIS — O99824 Streptococcus B carrier state complicating childbirth: Secondary | ICD-10-CM | POA: Diagnosis present

## 2016-08-02 DIAGNOSIS — Z3A40 40 weeks gestation of pregnancy: Secondary | ICD-10-CM

## 2016-08-02 LAB — CBC
HEMATOCRIT: 37.2 % (ref 36.0–46.0)
HEMATOCRIT: 35.7 % — AB (ref 36.0–46.0)
HEMOGLOBIN: 13.2 g/dL (ref 12.0–15.0)
HEMOGLOBIN: 12.5 g/dL (ref 12.0–15.0)
MCH: 33.8 pg (ref 26.0–34.0)
MCH: 33.4 pg (ref 26.0–34.0)
MCHC: 35.5 g/dL (ref 30.0–36.0)
MCHC: 35 g/dL (ref 30.0–36.0)
MCV: 95.4 fL (ref 78.0–100.0)
MCV: 95.5 fL (ref 78.0–100.0)
Platelets: 122 10*3/uL — ABNORMAL LOW (ref 150–400)
Platelets: 120 10*3/uL — ABNORMAL LOW (ref 150–400)
RBC: 3.9 MIL/uL (ref 3.87–5.11)
RBC: 3.74 MIL/uL — AB (ref 3.87–5.11)
RDW: 13.9 % (ref 11.5–15.5)
RDW: 13.9 % (ref 11.5–15.5)
WBC: 7.9 10*3/uL (ref 4.0–10.5)
WBC: 11.8 10*3/uL — ABNORMAL HIGH (ref 4.0–10.5)

## 2016-08-02 LAB — COMPREHENSIVE METABOLIC PANEL
ALBUMIN: 3 g/dL — AB (ref 3.5–5.0)
ALK PHOS: 127 U/L — AB (ref 38–126)
ALT: 20 U/L (ref 14–54)
AST: 31 U/L (ref 15–41)
Anion gap: 9 (ref 5–15)
BILIRUBIN TOTAL: 0.5 mg/dL (ref 0.3–1.2)
BUN: 13 mg/dL (ref 6–20)
CO2: 21 mmol/L — AB (ref 22–32)
Calcium: 9.4 mg/dL (ref 8.9–10.3)
Chloride: 106 mmol/L (ref 101–111)
Creatinine, Ser: 0.72 mg/dL (ref 0.44–1.00)
GFR calc Af Amer: >60 mL/min (ref >60)
GFR calc non Af Amer: >60 mL/min (ref >60)
Glucose, Bld: 77 mg/dL (ref 65–99)
POTASSIUM: 4 mmol/L (ref 3.5–5.1)
Sodium: 136 mmol/L (ref 135–145)
TOTAL PROTEIN: 5.9 g/dL — AB (ref 6.5–8.1)

## 2016-08-02 LAB — TYPE AND SCREEN
ABO/RH(D): A POS
Antibody Screen: NEGATIVE

## 2016-08-02 LAB — ABO/RH: ABO/RH(D): A POS

## 2016-08-02 LAB — RPR: RPR Ser Ql: NONREACTIVE

## 2016-08-02 LAB — PROTEIN / CREATININE RATIO, URINE
Creatinine, Urine: 27 mg/dL
PROTEIN CREATININE RATIO: 0.41 mg/mg{creat} — AB (ref 0.00–0.15)
Total Protein, Urine: 11 mg/dL

## 2016-08-02 SURGERY — Surgical Case
Anesthesia: Epidural | Wound class: Clean Contaminated

## 2016-08-02 MED ORDER — ACETAMINOPHEN 325 MG PO TABS
650.0000 mg | ORAL_TABLET | ORAL | Status: DC | PRN
  Administered 2016-08-03 – 2016-08-05 (×3): 650 mg via ORAL
  Filled 2016-08-02 (×3): qty 2

## 2016-08-02 MED ORDER — PHENYLEPHRINE HCL 10 MG/ML IJ SOLN
INTRAMUSCULAR | Status: DC | PRN
  Administered 2016-08-02 (×4): 40 ug via INTRAVENOUS

## 2016-08-02 MED ORDER — KETOROLAC TROMETHAMINE 30 MG/ML IJ SOLN: 30.0000 mg | Freq: Four times a day (QID) | INTRAMUSCULAR | Status: AC | PRN

## 2016-08-02 MED ORDER — NALOXONE HCL 0.4 MG/ML IJ SOLN
INTRAMUSCULAR | Status: DC | PRN
  Administered 2016-08-02 (×2): .12 mg via INTRAVENOUS
  Administered 2016-08-02: .08 mg via INTRAVENOUS

## 2016-08-02 MED ORDER — SODIUM CHLORIDE 0.9 % IV SOLN
500.0000 ug | Freq: Once | INTRAVENOUS | Status: AC
  Administered 2016-08-02: 0.5 mg via INTRAVENOUS
  Filled 2016-08-02: qty 0.5

## 2016-08-02 MED ORDER — KETOROLAC TROMETHAMINE 30 MG/ML IJ SOLN
INTRAMUSCULAR | Status: AC
  Administered 2016-08-02: 30 mg via INTRAMUSCULAR
  Filled 2016-08-02: qty 1

## 2016-08-02 MED ORDER — COSYNTROPIN 0.25 MG IJ SOLR: 0.5000 mg | Freq: Once | INTRAMUSCULAR | Status: DC

## 2016-08-02 MED ORDER — MEPERIDINE HCL 25 MG/ML IJ SOLN: 6.2500 mg | INTRAMUSCULAR | Status: DC | PRN

## 2016-08-02 MED ORDER — PENICILLIN G POT IN DEXTROSE 60000 UNIT/ML IV SOLN
3.0000 10*6.[IU] | INTRAVENOUS | Status: DC
  Administered 2016-08-02: 3 10*6.[IU] via INTRAVENOUS
  Filled 2016-08-02 (×3): qty 50

## 2016-08-02 MED ORDER — FENTANYL 2.5 MCG/ML BUPIVACAINE 1/10 % EPIDURAL INFUSION (WH - ANES)
14.0000 mL/h | INTRAMUSCULAR | Status: DC | PRN
  Administered 2016-08-02: 14 mL/h via EPIDURAL
  Filled 2016-08-02: qty 100

## 2016-08-02 MED ORDER — MIDAZOLAM HCL 5 MG/5ML IJ SOLN
INTRAMUSCULAR | Status: DC | PRN
  Administered 2016-08-02 (×2): 1 mg via INTRAVENOUS

## 2016-08-02 MED ORDER — COCONUT OIL OIL
1.0000 "application " | TOPICAL_OIL | Status: DC | PRN
  Administered 2016-08-03: 1 via TOPICAL
  Filled 2016-08-02: qty 120

## 2016-08-02 MED ORDER — ZOLPIDEM TARTRATE 5 MG PO TABS: 5.0000 mg | ORAL_TABLET | Freq: Every evening | ORAL | Status: DC | PRN

## 2016-08-02 MED ORDER — CEFAZOLIN SODIUM-DEXTROSE 2-3 GM-% IV SOLR
INTRAVENOUS | Status: DC | PRN
  Administered 2016-08-02: 2 g via INTRAVENOUS

## 2016-08-02 MED ORDER — FLEET ENEMA 7-19 GM/118ML RE ENEM: 1.0000 | ENEMA | RECTAL | Status: DC | PRN

## 2016-08-02 MED ORDER — NALOXONE HCL 0.4 MG/ML IJ SOLN: 0.4000 mg | INTRAMUSCULAR | Status: DC | PRN

## 2016-08-02 MED ORDER — LACTATED RINGERS IV SOLN
INTRAVENOUS | Status: DC
  Administered 2016-08-02 – 2016-08-03 (×2): 125 mL/h via INTRAVENOUS
  Administered 2016-08-03: 12:00:00 via INTRAVENOUS

## 2016-08-02 MED ORDER — LACTATED RINGERS IV SOLN
INTRAVENOUS | Status: DC | PRN
  Administered 2016-08-02: 10:00:00 via INTRAVENOUS

## 2016-08-02 MED ORDER — TERBUTALINE SULFATE 1 MG/ML IJ SOLN
0.2500 mg | Freq: Once | INTRAMUSCULAR | Status: DC | PRN
Start: 2016-08-02 — End: 2016-08-02

## 2016-08-02 MED ORDER — OXYTOCIN BOLUS FROM INFUSION: 500.0000 mL | Freq: Once | INTRAVENOUS | Status: DC

## 2016-08-02 MED ORDER — NALBUPHINE HCL 10 MG/ML IJ SOLN: 5.0000 mg | INTRAMUSCULAR | Status: DC | PRN

## 2016-08-02 MED ORDER — ONDANSETRON HCL 4 MG/2ML IJ SOLN: 4.0000 mg | Freq: Three times a day (TID) | INTRAMUSCULAR | Status: DC | PRN

## 2016-08-02 MED ORDER — LACTATED RINGERS IV SOLN
INTRAVENOUS | Status: DC | PRN
  Administered 2016-08-02 (×3): via INTRAVENOUS

## 2016-08-02 MED ORDER — PRENATAL MULTIVITAMIN CH
1.0000 | ORAL_TABLET | Freq: Every day | ORAL | Status: DC
  Administered 2016-08-03 – 2016-08-05 (×3): 1 via ORAL
  Filled 2016-08-02 (×3): qty 1

## 2016-08-02 MED ORDER — NALOXONE HCL 0.4 MG/ML IJ SOLN
INTRAMUSCULAR | Status: AC
  Filled 2016-08-02: qty 1

## 2016-08-02 MED ORDER — SIMETHICONE 80 MG PO CHEW
80.0000 mg | CHEWABLE_TABLET | ORAL | Status: DC | PRN
  Administered 2016-08-03: 80 mg via ORAL
  Filled 2016-08-02: qty 1

## 2016-08-02 MED ORDER — ONDANSETRON HCL 4 MG/2ML IJ SOLN
INTRAMUSCULAR | Status: AC
  Filled 2016-08-02: qty 2

## 2016-08-02 MED ORDER — EPHEDRINE 5 MG/ML INJ
INTRAVENOUS | Status: AC
  Filled 2016-08-02: qty 10

## 2016-08-02 MED ORDER — TETANUS-DIPHTH-ACELL PERTUSSIS 5-2.5-18.5 LF-MCG/0.5 IM SUSP: 0.5000 mL | Freq: Once | INTRAMUSCULAR | Status: DC

## 2016-08-02 MED ORDER — NALBUPHINE HCL 10 MG/ML IJ SOLN: 5.0000 mg | Freq: Once | INTRAMUSCULAR | Status: DC | PRN

## 2016-08-02 MED ORDER — DIPHENHYDRAMINE HCL 50 MG/ML IJ SOLN: 12.5000 mg | INTRAMUSCULAR | Status: DC | PRN

## 2016-08-02 MED ORDER — OXYTOCIN 40 UNITS IN LACTATED RINGERS INFUSION - SIMPLE MED
1.0000 m[IU]/min | INTRAVENOUS | Status: DC
  Administered 2016-08-02: 2 m[IU]/min via INTRAVENOUS

## 2016-08-02 MED ORDER — LACTATED RINGERS IV SOLN: 500.0000 mL | Freq: Once | INTRAVENOUS | Status: DC

## 2016-08-02 MED ORDER — TRAMADOL HCL 50 MG PO TABS
50.0000 mg | ORAL_TABLET | Freq: Four times a day (QID) | ORAL | Status: DC | PRN
  Administered 2016-08-03 – 2016-08-05 (×5): 50 mg via ORAL
  Filled 2016-08-02 (×5): qty 1

## 2016-08-02 MED ORDER — OXYTOCIN 10 UNIT/ML IJ SOLN
INTRAMUSCULAR | Status: AC
  Filled 2016-08-02: qty 4

## 2016-08-02 MED ORDER — EPHEDRINE 5 MG/ML INJ: 10.0000 mg | INTRAVENOUS | Status: DC | PRN

## 2016-08-02 MED ORDER — EPHEDRINE SULFATE 50 MG/ML IJ SOLN
INTRAMUSCULAR | Status: DC | PRN
  Administered 2016-08-02 (×2): 5 mg via INTRAVENOUS

## 2016-08-02 MED ORDER — MIDAZOLAM HCL 2 MG/2ML IJ SOLN
INTRAMUSCULAR | Status: AC
Start: 2016-08-02 — End: 2016-08-02
  Filled 2016-08-02: qty 2

## 2016-08-02 MED ORDER — LIDOCAINE HCL (PF) 1 % IJ SOLN: 30.0000 mL | INTRAMUSCULAR | Status: DC | PRN

## 2016-08-02 MED ORDER — PROPOFOL 500 MG/50ML IV EMUL
INTRAVENOUS | Status: DC | PRN
  Administered 2016-08-02: 25 ug/kg/min via INTRAVENOUS

## 2016-08-02 MED ORDER — PHENYLEPHRINE 40 MCG/ML (10ML) SYRINGE FOR IV PUSH (FOR BLOOD PRESSURE SUPPORT)
PREFILLED_SYRINGE | INTRAVENOUS | Status: AC
  Filled 2016-08-02: qty 10

## 2016-08-02 MED ORDER — WITCH HAZEL-GLYCERIN EX PADS: 1.0000 "application " | MEDICATED_PAD | CUTANEOUS | Status: DC | PRN

## 2016-08-02 MED ORDER — LACTATED RINGERS IV SOLN
500.0000 mL | Freq: Once | INTRAVENOUS | Status: AC
  Administered 2016-08-02: 500 mL via INTRAVENOUS

## 2016-08-02 MED ORDER — LIDOCAINE-EPINEPHRINE (PF) 2 %-1:200000 IJ SOLN
INTRAMUSCULAR | Status: AC
  Filled 2016-08-02: qty 20

## 2016-08-02 MED ORDER — SOD CITRATE-CITRIC ACID 500-334 MG/5ML PO SOLN
30.0000 mL | ORAL | Status: DC | PRN
  Administered 2016-08-02: 30 mL via ORAL
  Filled 2016-08-02: qty 15

## 2016-08-02 MED ORDER — SCOPOLAMINE 1 MG/3DAYS TD PT72
MEDICATED_PATCH | TRANSDERMAL | Status: DC | PRN
  Administered 2016-08-02: 1 via TRANSDERMAL

## 2016-08-02 MED ORDER — ACETAMINOPHEN 325 MG PO TABS: 650.0000 mg | ORAL_TABLET | ORAL | Status: DC | PRN

## 2016-08-02 MED ORDER — NALOXONE HCL 2 MG/2ML IJ SOSY: 0.2500 mg/h | PREFILLED_SYRINGE | INTRAVENOUS | Status: DC

## 2016-08-02 MED ORDER — PHENYLEPHRINE 40 MCG/ML (10ML) SYRINGE FOR IV PUSH (FOR BLOOD PRESSURE SUPPORT)
80.0000 ug | PREFILLED_SYRINGE | INTRAVENOUS | Status: DC | PRN
Start: 2016-08-02 — End: 2016-08-02
  Administered 2016-08-02 (×2): 80 ug via INTRAVENOUS

## 2016-08-02 MED ORDER — DEXAMETHASONE SODIUM PHOSPHATE 10 MG/ML IJ SOLN
INTRAMUSCULAR | Status: DC | PRN
  Administered 2016-08-02: 4 mg via INTRAVENOUS

## 2016-08-02 MED ORDER — EPHEDRINE 5 MG/ML INJ
10.0000 mg | INTRAVENOUS | Status: DC | PRN
  Filled 2016-08-02: qty 4

## 2016-08-02 MED ORDER — SIMETHICONE 80 MG PO CHEW
80.0000 mg | CHEWABLE_TABLET | Freq: Three times a day (TID) | ORAL | Status: DC
  Administered 2016-08-03 – 2016-08-05 (×9): 80 mg via ORAL
  Filled 2016-08-02 (×8): qty 1

## 2016-08-02 MED ORDER — SIMETHICONE 80 MG PO CHEW
80.0000 mg | CHEWABLE_TABLET | ORAL | Status: DC
  Administered 2016-08-03 (×2): 80 mg via ORAL
  Filled 2016-08-02 (×3): qty 1

## 2016-08-02 MED ORDER — LACTATED RINGERS IV SOLN
500.0000 mL | INTRAVENOUS | Status: DC | PRN
  Administered 2016-08-02: 500 mL via INTRAVENOUS

## 2016-08-02 MED ORDER — LIDOCAINE HCL (PF) 1 % IJ SOLN
INTRAMUSCULAR | Status: DC | PRN
  Administered 2016-08-02: 4 mL via EPIDURAL

## 2016-08-02 MED ORDER — DEXTROSE 5 % IV SOLN
0.0625 mg/h | INTRAVENOUS | Status: DC
  Administered 2016-08-02: 0.125 mg/h via INTRAVENOUS
  Filled 2016-08-02: qty 4

## 2016-08-02 MED ORDER — MORPHINE SULFATE (PF) 0.5 MG/ML IJ SOLN
INTRAMUSCULAR | Status: DC | PRN
  Administered 2016-08-02: 3 mg via EPIDURAL

## 2016-08-02 MED ORDER — IBUPROFEN 600 MG PO TABS
600.0000 mg | ORAL_TABLET | Freq: Four times a day (QID) | ORAL | Status: DC
  Administered 2016-08-03 – 2016-08-05 (×11): 600 mg via ORAL
  Filled 2016-08-02 (×11): qty 1

## 2016-08-02 MED ORDER — PROMETHAZINE HCL 25 MG/ML IJ SOLN: 6.2500 mg | INTRAMUSCULAR | Status: DC | PRN

## 2016-08-02 MED ORDER — EPHEDRINE 5 MG/ML INJ
10.0000 mg | INTRAVENOUS | Status: DC | PRN
Start: 2016-08-02 — End: 2016-08-02
  Administered 2016-08-02: 10 mg via INTRAVENOUS

## 2016-08-02 MED ORDER — SODIUM BICARBONATE 8.4 % IV SOLN
INTRAVENOUS | Status: DC | PRN
  Administered 2016-08-02: 7 mL via EPIDURAL
  Administered 2016-08-02: 3 mL via EPIDURAL

## 2016-08-02 MED ORDER — KETOROLAC TROMETHAMINE 30 MG/ML IJ SOLN
30.0000 mg | Freq: Four times a day (QID) | INTRAMUSCULAR | Status: AC | PRN
  Administered 2016-08-02: 30 mg via INTRAMUSCULAR

## 2016-08-02 MED ORDER — NALOXONE HCL 2 MG/2ML IJ SOSY
0.2500 mg/h | PREFILLED_SYRINGE | INTRAVENOUS | Status: DC
  Administered 2016-08-02: 0.25 mg/h via INTRAVENOUS
  Filled 2016-08-02: qty 4

## 2016-08-02 MED ORDER — NALOXONE HCL 2 MG/2ML IJ SOSY
0.2500 mg/h | PREFILLED_SYRINGE | INTRAVENOUS | Status: DC
  Filled 2016-08-02: qty 4

## 2016-08-02 MED ORDER — SENNOSIDES-DOCUSATE SODIUM 8.6-50 MG PO TABS
2.0000 | ORAL_TABLET | ORAL | Status: DC
  Administered 2016-08-03 – 2016-08-05 (×3): 2 via ORAL
  Filled 2016-08-02 (×3): qty 2

## 2016-08-02 MED ORDER — PHENYLEPHRINE 40 MCG/ML (10ML) SYRINGE FOR IV PUSH (FOR BLOOD PRESSURE SUPPORT)
80.0000 ug | PREFILLED_SYRINGE | INTRAVENOUS | Status: DC | PRN
  Administered 2016-08-02: 80 ug via INTRAVENOUS
  Filled 2016-08-02: qty 10

## 2016-08-02 MED ORDER — SODIUM CHLORIDE 0.9% FLUSH
3.0000 mL | INTRAVENOUS | Status: DC | PRN
  Administered 2016-08-05: 3 mL via INTRAVENOUS
  Filled 2016-08-02: qty 3

## 2016-08-02 MED ORDER — MORPHINE SULFATE (PF) 0.5 MG/ML IJ SOLN
INTRAMUSCULAR | Status: AC
  Filled 2016-08-02: qty 10

## 2016-08-02 MED ORDER — HYDROMORPHONE HCL 1 MG/ML IJ SOLN: 0.2500 mg | INTRAMUSCULAR | Status: DC | PRN

## 2016-08-02 MED ORDER — LACTATED RINGERS IV SOLN
INTRAVENOUS | Status: DC
  Administered 2016-08-02: 01:00:00 via INTRAVENOUS

## 2016-08-02 MED ORDER — BUTORPHANOL TARTRATE 1 MG/ML IJ SOLN
1.0000 mg | INTRAMUSCULAR | Status: DC | PRN
  Administered 2016-08-02: 1 mg via INTRAVENOUS
  Filled 2016-08-02: qty 1

## 2016-08-02 MED ORDER — OXYTOCIN 40 UNITS IN LACTATED RINGERS INFUSION - SIMPLE MED: 2.5000 [IU]/h | INTRAVENOUS | Status: AC

## 2016-08-02 MED ORDER — OXYTOCIN 40 UNITS IN LACTATED RINGERS INFUSION - SIMPLE MED
2.5000 [IU]/h | INTRAVENOUS | Status: DC
  Filled 2016-08-02: qty 1000

## 2016-08-02 MED ORDER — ONDANSETRON HCL 4 MG/2ML IJ SOLN
4.0000 mg | Freq: Four times a day (QID) | INTRAMUSCULAR | Status: DC | PRN
  Administered 2016-08-02: 4 mg via INTRAVENOUS
  Filled 2016-08-02: qty 2

## 2016-08-02 MED ORDER — PENICILLIN G POTASSIUM 5000000 UNITS IJ SOLR
5.0000 10*6.[IU] | Freq: Once | INTRAVENOUS | Status: AC
  Administered 2016-08-02: 5 10*6.[IU] via INTRAVENOUS
  Filled 2016-08-02: qty 5

## 2016-08-02 MED ORDER — DEXTROSE 5 % IV SOLN: 1.0000 ug/kg/h | INTRAVENOUS | Status: DC | PRN

## 2016-08-02 MED ORDER — DIPHENHYDRAMINE HCL 25 MG PO CAPS
25.0000 mg | ORAL_CAPSULE | ORAL | Status: DC | PRN
  Filled 2016-08-02: qty 1

## 2016-08-02 MED ORDER — PHENYLEPHRINE 40 MCG/ML (10ML) SYRINGE FOR IV PUSH (FOR BLOOD PRESSURE SUPPORT): 80.0000 ug | PREFILLED_SYRINGE | INTRAVENOUS | Status: DC | PRN

## 2016-08-02 MED ORDER — MENTHOL 3 MG MT LOZG
1.0000 | LOZENGE | OROMUCOSAL | Status: DC | PRN
  Filled 2016-08-02: qty 9

## 2016-08-02 MED ORDER — DIBUCAINE 1 % RE OINT: 1.0000 "application " | TOPICAL_OINTMENT | RECTAL | Status: DC | PRN

## 2016-08-02 MED ORDER — DIPHENHYDRAMINE HCL 25 MG PO CAPS: 25.0000 mg | ORAL_CAPSULE | Freq: Four times a day (QID) | ORAL | Status: DC | PRN

## 2016-08-02 MED ORDER — ONDANSETRON HCL 4 MG/2ML IJ SOLN
INTRAMUSCULAR | Status: DC | PRN
  Administered 2016-08-02: 4 mg via INTRAVENOUS

## 2016-08-02 MED ORDER — SCOPOLAMINE 1 MG/3DAYS TD PT72: 1.0000 | MEDICATED_PATCH | Freq: Once | TRANSDERMAL | Status: DC

## 2016-08-02 MED ORDER — OXYTOCIN 10 UNIT/ML IJ SOLN
INTRAVENOUS | Status: DC | PRN
  Administered 2016-08-02: 40 [IU] via INTRAVENOUS

## 2016-08-02 MED ORDER — METOCLOPRAMIDE HCL 5 MG/ML IJ SOLN
INTRAMUSCULAR | Status: DC | PRN
  Administered 2016-08-02: 10 mg via INTRAVENOUS

## 2016-08-02 SURGICAL SUPPLY — 39 items
BENZOIN TINCTURE PRP APPL 2/3 (GAUZE/BANDAGES/DRESSINGS) ×3 IMPLANT
CHLORAPREP W/TINT 26ML (MISCELLANEOUS) ×3 IMPLANT
CLAMP CORD UMBIL (MISCELLANEOUS) IMPLANT
CLOSURE STERI STRIP 1/2 X4 (GAUZE/BANDAGES/DRESSINGS) ×3 IMPLANT
CLOSURE WOUND 1/2 X4 (GAUZE/BANDAGES/DRESSINGS)
CLOTH BEACON ORANGE TIMEOUT ST (SAFETY) ×3 IMPLANT
CONTAINER PREFILL 10% NBF 15ML (MISCELLANEOUS) IMPLANT
DERMABOND ADVANCED (GAUZE/BANDAGES/DRESSINGS)
DERMABOND ADVANCED .7 DNX12 (GAUZE/BANDAGES/DRESSINGS) IMPLANT
DRSG OPSITE POSTOP 4X10 (GAUZE/BANDAGES/DRESSINGS) ×3 IMPLANT
ELECT REM PT RETURN 9FT ADLT (ELECTROSURGICAL) ×3
ELECTRODE REM PT RTRN 9FT ADLT (ELECTROSURGICAL) ×1 IMPLANT
EXTRACTOR VACUUM M CUP 4 TUBE (SUCTIONS) IMPLANT
EXTRACTOR VACUUM M CUP 4' TUBE (SUCTIONS)
GLOVE BIO SURGEON STRL SZ8 (GLOVE) ×3 IMPLANT
GLOVE BIOGEL PI IND STRL 7.0 (GLOVE) ×1 IMPLANT
GLOVE BIOGEL PI INDICATOR 7.0 (GLOVE) ×2
GOWN STRL REUS W/TWL LRG LVL3 (GOWN DISPOSABLE) ×6 IMPLANT
KIT ABG SYR 3ML LUER SLIP (SYRINGE) ×3 IMPLANT
NEEDLE HYPO 25X5/8 SAFETYGLIDE (NEEDLE) ×3 IMPLANT
NS IRRIG 1000ML POUR BTL (IV SOLUTION) ×3 IMPLANT
PACK C SECTION WH (CUSTOM PROCEDURE TRAY) ×3 IMPLANT
PAD ABD 7.5X8 STRL (GAUZE/BANDAGES/DRESSINGS) ×3 IMPLANT
PAD ABD 8X7 1/2 STERILE (GAUZE/BANDAGES/DRESSINGS) ×3 IMPLANT
PAD OB MATERNITY 4.3X12.25 (PERSONAL CARE ITEMS) ×3 IMPLANT
PENCIL SMOKE EVAC W/HOLSTER (ELECTROSURGICAL) ×3 IMPLANT
SPONGE GAUZE 4X4 12PLY STER LF (GAUZE/BANDAGES/DRESSINGS) ×6 IMPLANT
SPONGE GAUZE 4X4 STERILE 39 (GAUZE/BANDAGES/DRESSINGS) ×3 IMPLANT
STRIP CLOSURE SKIN 1/2X4 (GAUZE/BANDAGES/DRESSINGS) IMPLANT
SUT MNCRL 0 VIOLET CTX 36 (SUTURE) ×4 IMPLANT
SUT MONOCRYL 0 CTX 36 (SUTURE) ×8
SUT PDS AB 0 CTX 60 (SUTURE) ×3 IMPLANT
SUT PLAIN 0 NONE (SUTURE) IMPLANT
SUT PLAIN 2 0 (SUTURE) ×2
SUT PLAIN 2 0 XLH (SUTURE) IMPLANT
SUT PLAIN ABS 2-0 CT1 27XMFL (SUTURE) ×1 IMPLANT
SUT VIC AB 4-0 KS 27 (SUTURE) ×3 IMPLANT
TOWEL OR 17X24 6PK STRL BLUE (TOWEL DISPOSABLE) ×3 IMPLANT
TRAY FOLEY CATH SILVER 14FR (SET/KITS/TRAYS/PACK) ×3 IMPLANT

## 2016-08-02 NOTE — Anesthesia Preprocedure Evaluation (Addendum)
Anesthesia Evaluation  Patient identified by MRN, date of birth, ID band Patient awake    Reviewed: Allergy & Precautions, NPO status , Patient's Chart, lab work & pertinent test results  Airway Mallampati: II  TM Distance: >3 FB Neck ROM: Full    Dental  (+) Teeth Intact   Pulmonary neg pulmonary ROS,    breath sounds clear to auscultation       Cardiovascular negative cardio ROS   Rhythm:Regular Rate:Normal     Neuro/Psych  Headaches, negative psych ROS   GI/Hepatic negative GI ROS, Neg liver ROS,   Endo/Other  negative endocrine ROS  Renal/GU negative Renal ROS  negative genitourinary   Musculoskeletal negative musculoskeletal ROS (+)   Abdominal   Peds negative pediatric ROS (+)  Hematology negative hematology ROS (+)   Anesthesia Other Findings   Reproductive/Obstetrics (+) Pregnancy                            Lab Results  Component Value Date   WBC 7.9 08/02/2016   HGB 13.2 08/02/2016   HCT 37.2 08/02/2016   MCV 95.4 08/02/2016   PLT 122 (L) 08/02/2016     Anesthesia Physical Anesthesia Plan  ASA: II and emergent  Anesthesia Plan: Epidural   Post-op Pain Management:    Induction:   Airway Management Planned:   Additional Equipment:   Intra-op Plan:   Post-operative Plan:   Informed Consent: I have reviewed the patients History and Physical, chart, labs and discussed the procedure including the risks, benefits and alternatives for the proposed anesthesia with the patient or authorized representative who has indicated his/her understanding and acceptance.     Plan Discussed with: CRNA  Anesthesia Plan Comments:        Anesthesia Quick Evaluation

## 2016-08-02 NOTE — Transfer of Care (Signed)
Immediate Anesthesia Transfer of Care Note  Patient: Kimberly [name]  Procedure(s) Performed: Procedure(s): CESAREAN SECTION (N/A)  Patient Location: PACU  Anesthesia Type:Epidural  Level of Consciousness: awake, alert  and oriented  Airway & Oxygen Therapy: Patient Spontanous Breathing and Patient connected to nasal cannula oxygen  Post-op Assessment: Report given to RN and Post -op Vital signs reviewed and stable  Post vital signs: Reviewed and stable  Last Vitals:  Vitals:   08/02/16 0917 08/02/16 0919  BP: 105/65 113/72  Pulse: (!) 57 (!) 59  Resp: 16 16  Temp:      Last Pain:  Vitals:   08/02/16 0900  TempSrc: Oral  PainSc:       Patients Stated Pain Goal: 8 (08/02/16 0438)  Complications: No apparent anesthesia complications

## 2016-08-02 NOTE — Brief Op Note (Signed)
08/02/2016  10:04 AM  PATIENT:  Berneice HeinrichAshley Waring  41 y.o. female  PRE-OPERATIVE DIAGNosis: Fetal Distress  POST-OPERATIVE DIAGNOSIS:  Fetal Distress  PROCEDURE:  Procedure(s): CESAREAN SECTION (N/A) Emergent, Low Transverse  SURGEON:  Surgeon(s) and Role:    * Harold HedgeJames Manahil Vanzile, MD - Primary  PHYSICIAN ASSISTANT:   ASSISTANTS: none   ANESTHESIA:   epidural  EBL:  Total I/O In: 1000 [I.V.:1000] Out: -   BLOOD ADMINISTERED:none  DRAINS: Urinary Catheter (Foley)   LOCAL MEDICATIONS USED:  NONE  SPECIMEN:  Source of Specimen:  placenta  DISPOSITION OF SPECIMEN:  PATHOLOGY  COUNTS:  YES  TOURNIQUET:  * No tourniquets in log *  DICTATION: .Other Dictation: Dictation Number [id number]  PLAN OF CARE: Admit to inpatient   PATIENT DISPOSITION:  PACU - hemodynamically stable.   Delay start of Pharmacological VTE agent (>24hrs) due to surgical blood loss or risk of bleeding: not applicable

## 2016-08-02 NOTE — Progress Notes (Addendum)
Kimberly [name] Event Note: Called to bedside for low BP and brief fetal bradycardia. Phenylephrine and ephedrine given by RN with good BP response and fetal HR improved. Approximately 1hr later stat C/S called for persistent fetal bradycardia. I When I arrived to the OR pt was on OR table and being dosed through epidural catheter. I checked a level and she was T4 to pin prick. Surgery proceeded without issue and vigorous girl delivered. I left the OR to chart in the office and was called back because pt was having difficultly breathing and CRNA felt it was "high epidural." Pt had lost motor in hands bilaterally and was unable to phonate. She was maintaining ventilation and oxygenation with minimal support with mask ventilation. She was clearly in distress and I offered sedation which she accepted. She received 1mg  of versed and lost consciousness. She continued to ventilate with minimal support with mask. After case ended she remained unconscious. I consulted colleagues about proper course of action, given that she continued to breath, was needing only minimal support for BP, and HR was normal. I was assuming, at this point, she had an intrathecal epidural catheter. I aspirated with a 10 cc syringe and was able to easily aspirate 3 ml of clear fluid. Given that she was ventilating and maintaining BP I decided not to intubate her. She, however, remained unconscious and would not respond to stimuli. I was concerned for awareness, so I gave 1 mg versed, and started a low dose propofol infusion. I then learned that she also received 3 mg of morphine through the epidural catheter. My plan was then to antagonize the opioid and, if she remained unconscious, to intubate and take her to ICU to consult neurology for further workup. She responded, however, to 0.24mg  of naloxone and was able to move her hands as the spinal had receded. We decided to start a naloxone infusion assuming the presumed intrathecal morphine dose was  causing excess sedation and would likely cause clinically significant respiratory depression. She was taken to PACU and had a uneventful PACU stay. She was amnestic to everything after she received the first dose of versed. I discussed my findings and plan with Dr. Henderson [name], the patient, and husband. In PACU, she had full return of sensation and motor of lower extremities. CBC check for platelet count and epidural removed.   After patient transferred to ICU, she complained of visual hallucinations. I reduced her naloxone dose to 0.125mg /hr to attempt to reduce possible side effects. The patient and family express understanding and are on board with current plan. Dr. Acey [name] and I went to bedside for sign out.  Plan: Continue naloxone infusion at 0.125 mg/hr for total of 24 hours (stop at noon 08/03/2016). Pt is otherwise under no restrictions for mobility or diet. Vitals checks q2 hours. Please call anesthesia with questions or concerns.

## 2016-08-02 NOTE — Progress Notes (Signed)
Called anesthesia to remind to place new order.

## 2016-08-02 NOTE — Progress Notes (Signed)
Patient without complaint. Feeling much less "groggy" than she did downstairs.  Vitals:   08/02/16 1930 08/02/16 2000  BP:  126/67  Pulse: 80 84  Resp: (!) 21 16  Temp:  98.4 F (36.9 C)  O2 sat  95% on RA UO clear  A/P: Stable         Narcan infusion per anesthesiology         Telemed also monitoring and available

## 2016-08-02 NOTE — Progress Notes (Signed)
Anesthesia  At bedside. See new orders.

## 2016-08-02 NOTE — H&P (Signed)
Kimberly [name] is a 41 y.o. female presenting for IOL at term with favorable cervix. Started on Pitocin early this am. Epidural now in. Patient comfortable. OB History    Gravida Para Term Preterm AB Living   1             SAB TAB Ectopic Multiple Live Births                 Past Medical History:  Diagnosis Date  . AMA (advanced maternal age) primigravida 35+   . 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 Headache   . History of endometriosis   . History of kidney stones   . Nasal congestion 07/16/2011  . Sinus infection 07/16/2011   will finish antibiotic 07/18/2011   Past Surgical History:  Procedure Laterality Date  . ablation of endometriosis  2016  . KNEE ARTHROSCOPY  07/22/2011   Procedure: ARTHROSCOPY KNEE;  Surgeon: Loreta Aveaniel F Murphy, MD;  [facility];  Service: Orthopedics;  Laterality: Right;  with lateral release and with debridement/shaving (chondroplasty)  . SKIN GRAFT SPLIT THICKNESS LEG / FOOT  2008   right foot   Family History: family history includes Diabetes in her maternal grandmother; Heart attack in her paternal grandfather; Heart disease in her father; Lung cancer in her maternal grandfather; Thyroid disease in her maternal grandmother. Social History:  reports that she has never smoked. She has never used smokeless tobacco. She reports that she does not drink alcohol or use drugs.     Maternal Diabetes: No Genetic Screening: Normal Maternal Ultrasounds/Referrals: Normal Fetal Ultrasounds or other Referrals:  None Maternal Substance Abuse:  No Significant Maternal Medications:  None Significant Maternal Lab Results:  None Other Comments:  None  Review of Systems  Eyes: Negative for blurred vision.  Gastrointestinal: Negative for diarrhea.  Neurological: Negative for headaches.   Maternal Medical History:  Fetal activity: Perceived fetal activity is normal.      Dilation: 7 Effacement (%): 90 Station: -1 Exam by:: Craige CottaAmanda  Loye, RNC Blood pressure (!) 104/58, pulse 61, temperature 97.4 F (36.3 C), temperature source Oral, resp. rate 16, height 5\' 3"  (1.6 m), weight 175 lb (79.4 kg), SpO2 99 %. Maternal Exam:  Uterine Assessment: Contraction strength is firm.  Contraction frequency is regular.      Physical Exam  Cardiovascular: Normal rate.   Respiratory: Effort normal.  GI: Soft.    FHT had some variable decelerations secondary to hypotension after epidural. Treated, now FHT without significant decels.  Pitocin off  Prenatal labs: ABO, Rh: --/--/A POS, A POS (01/29 0102) Antibody: NEG (01/29 0102) Rubella: Immune (06/28 0000) RPR: Nonreactive (06/28 0000)  HBsAg: Negative (06/28 0000)  HIV: Non-reactive (06/28 0000)  GBS: Positive (12/22 0000)   Assessment/Plan: 41 yo G1P0 @ 40 0/7 weeks in active labor Leave Pitocin off for now Will recheck cervix in next hour   Pearlena Ow II,Sargent Mankey E 08/02/2016, 8:25 AM

## 2016-08-02 NOTE — Anesthesia Procedure Notes (Signed)
Epidural Patient location during procedure: OB Start time: 08/02/2016 6:47 AM End time: 08/02/2016 6:53 AM  Staffing Anesthesiologist: Shona SimpsonHOLLIS, Abella Shugart D Performed: anesthesiologist   Preanesthetic Checklist Completed: patient identified, site marked, surgical consent, pre-op evaluation, timeout performed, IV checked, risks and benefits discussed and monitors and equipment checked  Epidural Patient position: sitting Prep: ChloraPrep Patient monitoring: heart rate, continuous pulse ox and blood pressure Approach: midline Location: L3-L4 Injection technique: LOR saline  Needle:  Needle type: Tuohy  Needle gauge: 17 G Needle length: 9 cm Catheter type: closed end flexible Catheter size: 20 Guage Test dose: negative and 1.5% lidocaine  Assessment Events: blood not aspirated, injection not painful, no injection resistance and no paresthesia  Additional Notes LOR @ 5.5  Patient identified. Risks/Benefits/Options discussed with patient including but not limited to bleeding, infection, nerve damage, paralysis, failed block, incomplete pain control, headache, blood pressure changes, nausea, vomiting, reactions to medications, itching and postpartum back pain. Confirmed with bedside nurse the patient's most recent platelet count. Confirmed with patient that they are not currently taking any anticoagulation, have any bleeding history or any family history of bleeding disorders. Patient expressed understanding and wished to proceed. All questions were answered. Sterile technique was used throughout the entire procedure. Please see nursing notes for vital signs. Test dose was given through epidural catheter and negative prior to continuing to dose epidural or start infusion. Warning signs of high block given to the patient including shortness of breath, tingling/numbness in hands, complete motor block, or any concerning symptoms with instructions to call for help. Patient was given instructions on  fall risk and not to get out of bed. All questions and concerns addressed with instructions to call with any issues or inadequate analgesia.    Reason for block:procedure for pain

## 2016-08-02 NOTE — Anesthesia Postprocedure Evaluation (Signed)
Anesthesia Post Note  Patient: Kimberly [name]  Procedure(s) Performed: Procedure(s) (LRB): CESAREAN SECTION (N/A)  Patient location during evaluation: A-ICU Anesthesia Type: Epidural Level of consciousness: awake and alert and oriented Pain management: satisfactory to patient Vital Signs Assessment: post-procedure vital signs reviewed and stable Respiratory status: respiratory function stable Cardiovascular status: stable Postop Assessment: no headache, no backache, epidural receding, patient able to bend at knees, no signs of nausea or vomiting and adequate PO intake        Last Vitals:  Vitals:   08/02/16 1930 08/02/16 2000  BP:  126/67  Pulse: 80 84  Resp: (!) 21 16  Temp:  36.9 C    Last Pain:  Vitals:   08/02/16 2000  TempSrc: Oral  PainSc:    Pain Goal: Patients Stated Pain Goal: 3 (08/02/16 1930)               Karleen DolphinFUSSELL,Royal V[place]ort

## 2016-08-02 NOTE — Lactation Note (Signed)
This note was copied from a baby's chart. Lactation Consultation Note  Patient Name: Kimberly [name] Today's Date: 08/02/2016 Reason for consult: Initial assessment   With this mom and term baby, now 7 hours old, and full term. Mom was not able to breast feed until now, due to her being so unstableafter c-section. The baby was showing strong cues when I walked in the room. I assisted mom with cross cradle hold, and the baby latched eagerly, with wide mouth, and flanged lips, deep, strong rhythmic suckles, and great breast movement. I was able to easily express colostrum prior to latch, and mom was able to demonstrate with good technique. Basic breast feeding teaching done from the bay and mom care book, and lactation services  also reviewed. Parents very receptive to teaching, and dad very involved. Mom knows to call for questions/concerns.    Maternal Data Formula Feeding for Exclusion: Yes (mom was unstable after C-section, and baby was formula fed times 3 prior to mom being abe to breast feed. ) Reason for exclusion: Admission to Intensive Care Unit (ICU) post-partum Has patient been taught Hand Expression?: Yes Does the patient have breastfeeding experience prior to this delivery?: No  Feeding Feeding Type: Breast Fed Length of feed: 24 min  LATCH Score/Interventions Latch: Grasps breast easily, tongue down, lips flanged, rhythmical sucking.  Audible Swallowing: A few with stimulation  Type of Nipple: Everted at rest and after stimulation  Comfort (Breast/Nipple): Soft / non-tender     Hold (Positioning): Assistance needed to correctly position infant at breast and maintain latch. Intervention(s): Breastfeeding basics reviewed;Support Pillows;Position options;Skin to skin  LATCH Score: 8  Lactation Tools Discussed/Used     Consult Status Consult Status: Follow-up Date: 08/03/16 Follow-up type: In-patient    Alfred LevinsLee, Aveyah Greenwood Anne 08/02/2016, 4:52 PM

## 2016-08-02 NOTE — Progress Notes (Signed)
MD has adjusted narcan drip rate while at bedside.  New rate order forthcoming.

## 2016-08-02 NOTE — Progress Notes (Signed)
Initial visit with Kimberly [name] and her husband to introduce spiritual care services and offer support upon the eventful delivery of baby Kimberly [name] after a code cesarean this morning.  Both parents are doing well and happy that everyone is stable now.   We discussed how frightening it was to need the emergency cesarean and for Mr Kimberly [name] to then watch his wife go through distress while in the operating room.  They are just settling in to their room, so the visit was short, but we discussed that it can be helpful to process difficult experiences in the days after and they are aware that we are here for ongoing support.  Please page as further needs arise.  Maryanna ShapeAmanda M. Carley Hammedavee Lomax, M.Div. [facility] Chaplain Pager [phone] Office [phone]

## 2016-08-02 NOTE — Progress Notes (Signed)
Patient had a run of trigeminal PVC,s.  Patient awake holding infant skin to skin. Denies any discomfort at this time.

## 2016-08-03 LAB — CBC
HCT: 30.3 % — ABNORMAL LOW (ref 36.0–46.0)
Hemoglobin: 10.6 g/dL — ABNORMAL LOW (ref 12.0–15.0)
MCH: 34.1 pg — AB (ref 26.0–34.0)
MCHC: 35 g/dL (ref 30.0–36.0)
MCV: 97.4 fL (ref 78.0–100.0)
Platelets: 117 10*3/uL — ABNORMAL LOW (ref 150–400)
RBC: 3.11 MIL/uL — AB (ref 3.87–5.11)
RDW: 14.2 % (ref 11.5–15.5)
WBC: 13.1 10*3/uL — ABNORMAL HIGH (ref 4.0–10.5)

## 2016-08-03 MED ORDER — OXYCODONE-ACETAMINOPHEN 5-325 MG PO TABS: 1.0000 | ORAL_TABLET | Freq: Four times a day (QID) | ORAL | Status: DC | PRN

## 2016-08-03 NOTE — Progress Notes (Signed)
Called nursery regarding bili screen and hearing screen .

## 2016-08-03 NOTE — Progress Notes (Signed)
Patient is having mild vision disturbance, no increase in blood pressure, no temp, no upper epigastric pain.  Anesthesia notified.

## 2016-08-03 NOTE — Progress Notes (Signed)
Subjective: Postpartum Day 1: Cesarean Delivery Patient reports incisional pain and tolerating PO.  Feeling much better this AM than yesterday.  No SOB.  Tylenol only for pain (on Narcan drip).  Not out of bed yet today.   Objective: Vital signs in last 24 hours: Temp:  [93.7 F (34.3 C)-98.6 F (37 C)] 98.6 F (37 C) (01/30 0400) Pulse Rate:  [46-99] 84 (01/30 0700) Resp:  [11-30] 23 (01/30 0700) BP: (90-138)/(51-87) 104/64 (01/30 0600) SpO2:  [92 %-100 %] 96 % (01/30 0700)  Physical Exam:  General: alert, cooperative and appears stated age 35Lochia: appropriate Uterine Fundus: firm Incision: pressure dressing in place DVT Evaluation: No evidence of DVT seen on physical exam. Negative Homan's sign. No cords or calf tenderness.   Recent Labs  08/02/16 1500 08/03/16 0516  HGB 12.5 10.6*  HCT 35.7* 30.3*    Assessment/Plan: Status post Cesarean section. Doing well postoperatively.  Continue current care. Per Anesthesia, plan to d/c Narcan drip at 10 am.  Continue telemetry.   Tramadol prn pain   Muzamil Harker 08/03/2016, 8:58 AM

## 2016-08-03 NOTE — Progress Notes (Signed)
Off Narcan drip. No signs of respiratory depression. Has had some mild blurring of vision, probably related to Scopolamine patch. Denies HA. Will hep-lock IV. Ok to d/c Foley if ok with OB.

## 2016-08-03 NOTE — Addendum Note (Signed)
Addendum  created 08/03/16 [id number] by Mal AmabileMichael Lashala Laser, MD   Sign clinical note

## 2016-08-03 NOTE — Progress Notes (Signed)
Patient having occasional PVCs during breastfeeding, asymptomatic.

## 2016-08-03 NOTE — Progress Notes (Signed)
MD at bedside. 

## 2016-08-03 NOTE — Progress Notes (Signed)
Spoke with Dr. Langston MaskerMorris, ok to take out foley.

## 2016-08-03 NOTE — Op Note (Signed)
NAMEVIVIENE, Kimberly [name]             ACCOUNT NO.:  [id number]  MEDICAL RECORD NO.:  [id number]  LOCATION:  [facility]                        FACILITY:  [facility]  PHYSICIAN:  Guy Sandifer. Henderson Cloud, M.D. DATE OF BIRTH:  05/05/1976  DATE OF PROCEDURE:  08/02/2016 DATE OF DISCHARGE:                              OPERATIVE REPORT   PREOPERATIVE DIAGNOSIS:  Fetal distress.  POSTOPERATIVE DIAGNOSIS:  Fetal distress.  PROCEDURE:  Emergent low transverse cesarean section.  SURGEON:  Guy Sandifer. Henderson Cloud, M.D.  ANESTHESIA:  Epidural.  ESTIMATED BLOOD LOSS:  650 mL.  FINDINGS:  Viable female infant, Apgars of 9 and 9.  Arterial cord pH and birth weight pending.  SPECIMENS:  Placenta to Pathology.  INDICATIONS AND CONSENT:  This patient is a 41 year old married white female, G1, P0 at 27 and 0/7th weeks.  She is admitted this morning for induction of labor with a favorable cervix.  She had an epidural placed and had some variable D cells and hypertension, treated with medication and IV fluids.  Her cervix was rim, -2 station, ROP position, and complete effacement.  Artificial rupture of membranes was carried out for clear fluid.  Fetal scalp electrode was placed.  Baby was noted to have a fetal heartbeat in the 30s.  This was confirmed with the external monitor as well.  She was then rolled from side to side without resolution.  Emergent cesarean section was called and quickly discussed with the patient and her husband.  DESCRIPTION OF PROCEDURE:  She has taken rapidly to the operating room. Fetal heart rate there was noted to be in the 50s.  Foley catheter was already in place.  She was placed in the dorsal supine position with a left lateral tilt.  Betadine is used to rapidly prepped the abdomen. She was then draped with a sterile drape.  Time-out was undertaken. After testing for adequate epidural anesthesia, skin was entered through a Pfannenstiel incision and dissection was carried out in layers to  the fascia, which was taken down cephalolaterally.  Peritoneum was sharply entered and extended bluntly.  Vesicouterine peritoneum was taken down cephalolaterally.  The uterus was then incised in a low transverse manner and the uterine cavity was entered bluntly with a hemostat.  The uterine incision was then extended cephalolaterally with fingers.  Thin meconium was noted.  Vertex was then delivered.  The head was not engaged.  Good cry and tone were noted.  Therefore, after 1 minute, the cord was clamped and cut and the baby was handed to awaiting pediatrics team.  Placenta was manually delivered and sent to Pathology.  Uterine cavity was clean.  Uterus was closed in 2 running locking imbricating layers of 0 Monocryl suture.  Lavage was carried out and good hemostasis was noted.  Anterior peritoneum was closed in a running fashion with 0 Monocryl suture, which was also used to reapproximate the pyramidalis muscle in midline.  Anterior rectus fascia was closed in a running fashion with a looped 0 PDS suture.  Subcutaneous layer was closed with interrupted plain and the skin was closed in a subcuticular fashion with 4-0 Vicryl on a Keith needle. Benzoin and Steri-Strips were applied.  Honeycomb and pressure dressings were  applied.  All counts were correct.  The patient was taken to recovery room in stable condition.     Guy SandiferJames E. Henderson Cloudomblin, M.D.     JET/MEDQ  D:  08/02/2016  T:  08/03/2016  Job:  [id number]

## 2016-08-03 NOTE — Lactation Note (Signed)
This note was copied from a baby'[name] chart. Lactation Consultation Note  Patient Name: Kimberly [name] [name] Date: 08/03/2016 Reason for consult: Follow-up assessment Mom states baby had a good feeding earlier this AM.  Assisted with positioning baby in football hold on right breast.  Baby showing feeding cues.  Baby latched easily and wide gape with flanged lips noted.  Mom felt initial latch on pain which subsided quickly.  Observed baby feeding actively with swallows for 20 minutes and still nursing when I left.  Reviewed basics and answered questions.  My phone number left to call for assist prn today.  Maternal Data    Feeding Feeding Type: Breast Fed Length of feed: 20 min  LATCH Score/Interventions Latch: Grasps breast easily, tongue down, lips flanged, rhythmical sucking. Intervention([name]): Adjust position;Assist with latch;Breast massage;Breast compression  Audible Swallowing: Spontaneous and intermittent Intervention([name]): Skin to skin;Hand expression;Alternate breast massage  Type of Nipple: Everted at rest and after stimulation  Comfort (Breast/Nipple): Soft / non-tender     Hold (Positioning): Assistance needed to correctly position infant at breast and maintain latch. Intervention([name]): Breastfeeding basics reviewed;Support Pillows;Position options;Skin to skin  LATCH Score: 9  Lactation Tools Discussed/Used     Consult Status Consult Status: Follow-up Date: 08/04/16 Follow-up type: In-patient    Kimberly [name], Kimberly [name] 08/03/2016, 11:50 AM

## 2016-08-03 NOTE — Progress Notes (Signed)
Patient seen and evaluated. No evidence of respiratory depression. Hallucinations have abated, suspect it was due to scopolamine patch which has been removed. She had some nausea last night but that has resolved. She has been taking tylenol for discomfort. She has been having PVC's on cardiac monitor, suspect they are insignificant.Will decrease narcan infusion by half for next 2 hours, then D/C. Will continue to monitor for respiratory depression for 4 more hours after stopping. Will monitor for PDPHA. Questions of patient and husband were answered.

## 2016-08-03 NOTE — Addendum Note (Signed)
Addendum  created 08/03/16 1336 by Mal AmabileMichael Katelyne Galster, MD   Sign clinical note

## 2016-08-03 NOTE — Progress Notes (Signed)
Anesthesia at bedside, discussing plan of care. See new orders

## 2016-08-04 MED ORDER — CYCLOBENZAPRINE HCL 10 MG PO TABS
10.0000 mg | ORAL_TABLET | Freq: Three times a day (TID) | ORAL | Status: DC | PRN
  Administered 2016-08-04: 10 mg via ORAL
  Filled 2016-08-04 (×2): qty 1

## 2016-08-04 NOTE — Progress Notes (Addendum)
Assumed care of pt. Pt b.feeding infant at this time. Informed pt to call when done for infant vs.   1930: pt called out after being done with b.feeding. F/F@U  with scant lochia. See flow sheet for Vs.   2245: Assisted pt with latching infant to breast.   2310: compression and  Expression of breast explained and initiated. Green color colostrum noted onto the spoon. Did this for 10 mins on each breast. Placed colostrum in small bottle and informed pt to feed infant this for next feeding. Pt verbalized understanding. Pt okay to feed infant supplement. Infant ate 38ml in 10 mins.   [id number]: woke pt up for feeding. Assisted with latch on.   0620: Lab at bs for T&S.

## 2016-08-04 NOTE — Progress Notes (Signed)
Instructed patient not to fall asleep with baby in bed with her.  Patient verbalized understanding. 

## 2016-08-04 NOTE — Lactation Note (Signed)
This note was copied from a baby's chart. Lactation Consultation Note  Patient Name: Girl Berneice Heinrichshley Gu MVHQI'OToday's Date: 08/04/2016 Reason for consult: Follow-up assessment   With this mom and term baby, now 253 hours old. The baby is having abundant wet and dirty diapers, which accounts for part of her 7.7% weight loss last evening, but is still cluster feeding, with little colostrum seen in shield after feeding. I started mom pumping every 3 hours, or as much as she can do, to protect her milk supply and provide EBm for the baby. Mom pumped and expressed drops of colostrum which I fed to [place]   on a gloved finger. The baby was asleep in MGM arms. Mom to sleep for now, and I will be called when baby is ready to feed. Mom has agreed to supplementing with Alimentum formula, at her breast, to give mom some time to rest,. She had a very rough delivery, and has slept little since then. I would also like to recheck baby's weight, to see if her weight loss is increasing or stabilizing.    Maternal Data    Feeding Feeding Type: Breast Fed Length of feed: 20 min  LATCH Score/Interventions                      Lactation Tools Discussed/Used Tools: Nipple [employer] [facility]: No Pump Review: Setup, frequency, and cleaning;Milk Storage;Other (comment) (hand expression, mantenance setting) Initiated by:: Danton Clapchristine Abdi Husak, RN, IBCLC Date initiated:: 08/04/16   Consult Status Consult Status: Follow-up Date: 08/05/16 Follow-up type: In-patient    Alfred LevinsLee, Haydn Hutsell Anne 08/04/2016, 2:55 PM

## 2016-08-04 NOTE — Progress Notes (Signed)
Patient doing better. Pain is a little issue. On tramadol and still feeling sore.  BP (!) 132/57 (BP Location: Right Arm)   Pulse 70   Temp 98 F (36.7 C) (Oral)   Resp 16   Ht 5\' 3"  (1.6 m)   Wt 79.4 kg (175 lb)   SpO2 96%   Breastfeeding? Unknown   BMI 31.00 kg/m  Abdomen is soft and non tender  Bandage is dry  No results found for this or any previous visit (from the past 24 hour(s)).   Impression: POD #3 C Section Intrathecal narcotic at time of C Section  Plan: Patient is doing well. Will add flexeril to tramadol today. Probable discharge tomorrow

## 2016-08-04 NOTE — Lactation Note (Signed)
This note was copied from a baby's chart. Lactation Consultation Note Lc follow up at 56 hours of age.  Bedside Rn called to request visit with showing parents how to supplement baby.  Baby has had 10 feedings with 4 voids, 3 stools and LATCH scores of "6-7".  Baby initially lost 8% with increased output noted. Lc arrived as baby just finished a feeding of about 30 minutes on right breast and then baby fell asleep.  Mom reports active sucking, some pain and nipple round after feeding.  Baby showing frantic feeding cues at this time. Parents requested a weight check to determine need for supplementation.  Baby's previous weight check approximately 18 hours previous was 6# 11.3oz, new weight with scale in room was 6#10oz showing stable weight loss.   Lc encouraged mom to offer 2nd breast as baby is showing feeding cues.  Baby noted to have cobblestone lip appearance possible from pressure baby applies with lips during feeding.  Short tight thin anterior frenulum near tip of tongue noted when baby cried.  LC allowed baby to suck gloved finger with tongue "snapping" and then baby settled into a more rhythmic suck with intermittent biting noted.  Mom is not using NS due to reports it was not staying on and she didn't like it.  Mom agreeable to using as needed, but is reporting tolerant to nipple discomfort.  Nipples are intact while mom complains of nipple pain.  Lc advised if pain continues it may be an indication of needing NS. Parents asking a lot of questions about "tongue tie" and "tongue function".  Lc encouraged parents to explore resources for additional information and assured parents we would continue to work on breastfeeding goals during hospital stay. Baby latched well to left breast in cross cradle hold with wide gape and strong rhythmic sucking.  Some stimulation needed to maintain feeding near the end.  Several audible swallows noted and pointed out to parents.  When removed from breast tip of  nipple blanched with slight compression and mom reports right nipple did not have same appearance after last feeding.    Baby appears to be transferring milk from the breast well at this time.  Lc re-weighed baby after 20 minute feeding and advised parents it may not be as accurate for smaller volumes.  Scale indicates 35ml gain after breastfeeding at left breast.     Patient Name: Girl Berneice Heinrichshley Kisiel Today's Date: 08/04/2016 Reason for consult: Follow-up assessment;Breast/nipple pain   Maternal Data Has patient been taught Hand Expression?: Yes  Feeding Feeding Type: Breast Fed Length of feed: 20 min  LATCH Score/Interventions Latch: Grasps breast easily, tongue down, lips flanged, rhythmical sucking. Intervention(s): Adjust position;Assist with latch;Breast massage;Breast compression  Audible Swallowing: A few with stimulation Intervention(s): Skin to skin;Hand expression  Type of Nipple: Everted at rest and after stimulation  Comfort (Breast/Nipple): Filling, red/small blisters or bruises, mild/mod discomfort  Problem noted: Mild/Moderate discomfort Interventions (Filling): Massage Interventions (Mild/moderate discomfort): Hand expression  Hold (Positioning): Assistance needed to correctly position infant at breast and maintain latch. Intervention(s): Breastfeeding basics reviewed;Support Pillows;Position options;Skin to skin  LATCH Score: 7  Lactation Tools Discussed/Used     Consult Status Consult Status: Follow-up Follow-up type: In-patient    Beverely RisenShoptaw, Arvella MerlesJana Lynn 08/04/2016, 6:57 PM

## 2016-08-04 NOTE — Lactation Note (Signed)
This note was copied from a baby's chart. Lactation Consultation Note  Patient Name: Girl Berneice Heinrichshley Lobb ZOXWR'UToday's Date: 08/04/2016 Reason for consult: Follow-up assessment   With this mom and term baby, now 648 hours old. Mom's milk is beginning to transition in. Mom reports sore nipples, and on exam of baby's mouth, she has an upper lip frenulum that extends to her gum line, and a lingual anterior frenulum, looses enough for her to feed fairly well, but causing mom nippel pain, and Briley to have the appearance of chapped lips, from using her lips to compensate  For the suction her tongue can not maintain.  The baby is a 7.7% weight loss at 38 hours of age, slightly higher than normal. I showed parents my findings, and also spoke to pediatrician, Dr. Hosie PoissonSumner, about these. Parents very receptive to my teaching. I fitted mom with a 20 nipple shield on the right and 24 on left.Mom reports latch with improved comfort with shield. Mom also taught how to apply, with good return demonstration.  The baby latched well with nipple shield, after a minute or so of getting used to the different feel,and was able to be advanced deeply on the breast, with good breast movement. Colostrum/milk felt in the shield, tiny drops seen, and baby appeared satiated after feeding. She has been cluster feeding, but voiding and stooling WNL. Stool is now transitioned to dark green, seedy.  Dr. Hosie PoissonSumner said his practice would refer mom and Mason JimBriley to thier lactation consultant, Jimmye NormanBeth Sanders, for a consult, after discharge. Mom and baby to be discharged tomorrow, and Mason [facility]Briley [facility]    Maternal Data    Feeding Feeding Type: Breast Fed Length of feed: 15 min  LATCH Score/Interventions Latch: Repeated attempts needed to sustain latch, nipple held in mouth throughout feeding, stimulation needed to elicit sucking reflex. (used 20 nipploe shield, baby latched deeply, mom with increased  comfort) Intervention(s): Adjust position;Assist with latch;Breast compression  Audible Swallowing: A few with stimulation Intervention(s): Hand expression  Type of Nipple: Everted at rest and after stimulation  Comfort (Breast/Nipple): Filling, red/small blisters or bruises, mild/mod discomfort  Problem noted: Filling;Mild/Moderate discomfort (tender, red, some tiny scabs on nippl tips) Interventions (Mild/moderate discomfort): Comfort gels  Hold (Positioning): Assistance needed to correctly position infant at breast and maintain latch. Intervention(s): Breastfeeding basics reviewed;Support Pillows;Position options  LATCH Score: 6  Lactation Tools Discussed/Used Tools: Nipple Shields Nipple shield size: 20;24;Other (comment) (20 fits well on left nipple, 24 on right)   Consult Status Consult Status: Follow-up Date: 08/05/16 Follow-up type: In-patient    Alfred LevinsLee, Myrl Bynum Anne 08/04/2016, 10:41 AM

## 2016-08-05 ENCOUNTER — Encounter (HOSPITAL_COMMUNITY): Payer: Self-pay

## 2016-08-05 LAB — CBC
HCT: 33.1 % — ABNORMAL LOW (ref 36.0–46.0)
Hemoglobin: 11.4 g/dL — ABNORMAL LOW (ref 12.0–15.0)
MCH: 33.3 pg (ref 26.0–34.0)
MCHC: 34.4 g/dL (ref 30.0–36.0)
MCV: 96.8 fL (ref 78.0–100.0)
PLATELETS: 143 10*3/uL — AB (ref 150–400)
RBC: 3.42 MIL/uL — AB (ref 3.87–5.11)
RDW: 14.1 % (ref 11.5–15.5)
WBC: 9.5 10*3/uL (ref 4.0–10.5)

## 2016-08-05 MED ORDER — IBUPROFEN 600 MG PO TABS: 600.0000 mg | ORAL_TABLET | Freq: Four times a day (QID) | ORAL | 1 refills | Status: DC | PRN

## 2016-08-05 MED ORDER — TRAMADOL HCL 50 MG PO TABS: 50.0000 mg | ORAL_TABLET | Freq: Four times a day (QID) | ORAL | 0 refills | Status: DC | PRN

## 2016-08-05 MED ORDER — CYCLOBENZAPRINE HCL 10 MG PO TABS: 10.0000 mg | ORAL_TABLET | Freq: Three times a day (TID) | ORAL | 0 refills | Status: DC | PRN

## 2016-08-05 NOTE — Lactation Note (Addendum)
This note was copied from a baby's chart. Lactation Consultation Note RN called d/t mom concerned of green colostrum when hand expressed.  LC printed information about reasoning of green colostrum., took information to rm. [facility] to [facility] that mom was resting and please do not distrub.  Left information w/RN to give to pt. Explained to RN color is d/t to diet or medication. LC will f/u during the daytime. Cont. To stimulate by BF, pumping, and giving any colostrum Hand expressed. If has any further questions please call.  Patient Name: Kimberly [name] WJXBJ'YToday's Date: 08/05/2016 Reason for consult: Follow-up assessment;Other (Comment) (c/o green colostrum)   Maternal Data    Feeding Feeding Type: Breast Fed Nipple Type: Regular  LATCH Score/Interventions                      Lactation Tools Discussed/Used Tools: Pump Breast pump type: Double-Electric Breast Pump Pump Review: Setup, frequency, and cleaning;Milk Storage   Consult Status Consult Status: Follow-up Date: 08/05/16 Follow-up type: In-patient    Cuyler Vandyken, Diamond NickelLAURA G 08/05/2016, 4:09 AM

## 2016-08-05 NOTE — Lactation Note (Signed)
This note was copied from a baby's chart. Lactation Consultation Note  Patient Name: Kimberly [name] Today's Date: 08/05/2016  Follow up visit made prior to discharge.  Parents state that they started supplementing during the night due to weight loss and baby not content after feedings.  Plan for discharge is to put baby to breast first with feeding cues, supplement with expressed milk/formula per bottle giving baby amount desired and post pumping after every other breastfeeding.  Baby is at a 10% weight loss and will be weighed tomorrow at pediatricians. Mom is not using nipple shield because she didn't find it helpful.  Lactation outpatient appointment scheduled for Wednesday 08/11/16 at 2:30 pm.  Encouraged to call with concerns prn.   Maternal Data    Feeding    LATCH Score/Interventions                      Lactation Tools Discussed/Used     Consult Status      Huston FoleyMOULDEN, Shavonte Zhao S 08/05/2016, 2:28 PM

## 2016-08-05 NOTE — Discharge Summary (Signed)
Obstetric Discharge Summary Reason for Admission: induction of labor Prenatal Procedures: none Intrapartum Procedures: cesarean: low cervical, transverse Postpartum Procedures: Narcan drip for correction of narcotic respiratory depression Complications-Operative and Postpartum: narcotic respiratory depression Hemoglobin  Date Value Ref Range Status  08/05/2016 11.4 (L) 12.0 - 15.0 g/dL Final   HCT  Date Value Ref Range Status  08/05/2016 33.1 (L) 36.0 - 46.0 % Final    Physical Exam:  General: alert Lochia: appropriate Uterine Fundus: firm Incision: healing well DVT Evaluation: No evidence of DVT seen on physical exam.  Discharge Diagnoses: Term Pregnancy-delivered, post op narcotic depression  Discharge Information: Date: 08/05/2016 Activity: pelvic rest Diet: routine Medications: PNV, Ibuprofen and Tramadol Condition: stable Instructions: refer to practice specific booklet Discharge to: home Follow-up Information    [facility] Schedule an appointment as soon as possible for a visit in 1 week(s).   Contact information: [address] [place] [address] [phone]           Newborn Data: Live born female  Birth Weight: 7 lb 4.2 oz (3295 g) APGAR: 9, 9  Home with mother.  Meriel PicaHOLLAND,Trampas Stettner M 08/05/2016, 8:46 AM

## 2016-08-05 NOTE — Progress Notes (Signed)
Subjective: Postpartum Day 4: Cesarean Delivery Patient reports tolerating PO.    Objective: Vital signs in last 24 hours: Temp:  [97.8 F (36.6 C)-98.3 F (36.8 C)] 97.8 F (36.6 C) (02/01 0337) Pulse Rate:  [64-83] 72 (02/01 0337) Resp:  [16-18] 16 (02/01 0337) BP: (124-150)/(63-83) 124/83 (02/01 0337) SpO2:  [97 %-100 %] 98 % (02/01 0337)  Physical Exam:  General: alert Lochia: appropriate Uterine Fundus: firm Incision: healing well DVT Evaluation: No evidence of DVT seen on physical exam.   Recent Labs  08/03/16 0516 08/05/16 0620  HGB 10.6* 11.4*  HCT 30.3* 33.1*    Assessment/Plan: Status post Cesarean section. Doing well postoperatively.  D/C>>office 1 week.  Meriel PicaHOLLAND,Magnus Crescenzo M 08/05/2016, 8:43 AM

## 2016-08-11 ENCOUNTER — Ambulatory Visit: Payer: Self-pay

## 2016-08-11 ENCOUNTER — Ambulatory Visit (HOSPITAL_COMMUNITY): Admit: 2016-08-11 | Payer: BC Managed Care – PPO

## 2016-08-11 NOTE — Lactation Note (Signed)
This note was copied from a baby's chart. Lactation Consult  Mother's reason for visit:  Follow up from hospital Visit Type: Feeding assessment Appointment Notes:  10% weight loss at discharge Consult:  Initial Lactation Consultant:  Huston Foley  ________________________________________________________________________  Baby's Name:  Kimberly [name] Date of Birth:  08/02/2014 Pediatrician:  [facility] Gender:  female Gestational Age: [redacted]w[redacted]d (At Birth) Birth Weight:  7 lb 4.2 oz (3295 g) Weight at Discharge:    6-8.4                      Date of Discharge:  08/05/16 There were no vitals filed for this visit. Last weight taken from location outside of [facility]:6-13 ON 08/06/16      Location:Pediatrician's office Weight today:  7-6.5  ________________________________________________________________________  Mother's Name:Kimberly [name]  delivery:  C-Section, Low Transverse Breastfeeding Experience:  First baby  Maternal Medications:  ADVIL, TYLENOL, FLEXIREL, PNV'S  ________________________________________________________________________  Breastfeeding History (Post Discharge)  Frequency of breastfeeding:  4 TIMES PER DAY SINCE YESTERDAY Duration of feeding:  15 MINUTES  Supplementation  Formula:  Volume 60-32ml Frequency:  EVERY 3 HOURS        Brand: Similac  Breastmilk:  Volume 15-73ml Frequency:  4 TIMES PER DAY   Method:  Bottle,   Pumping  Type of pump:  Medela pump in style Frequency:  3-4 TIMES PER DAY Volume:  15-46ml  Infant Intake and Output Assessment  Voids:6-8  24 hrs.  Color:  Clear yellow Stools: 4-6  in 24 hrs.  Color:  Yellow  ________________________________________________________________________  Maternal Breast Assessment  Breast:  Filling and Compressible Nipple:  Erect Pain level:  0 Pain interventions:  Bra  _______________________________________________________________________ Feeding Assessment/Evaluation  Mom  and 9 day old baby here for feeding assessment.  Mom had some problems with anesthesia post op C/S and was admitted to [facility].  Baby nursed well once mom was feeling better and pumping was also initiated.  Mom and baby were discharged on 08/05/16 and mom rested nipples and only pumped for a few days.  Baby is now going to breast more but milk supply is low.  Observed baby nurse well for 15 minutes and only transfer 2 mls.  Goal is to increase milk supply by pumping 8 times/24 hours.  Plan is to put baby to breast first 3-4 times/day so she doesn't become to overwhelmed with feeding and pumping.  Mom will also consider herbal supplementation.  Praised mom for her efforts and stressed that her baby is still benefiting from the milk she is providing.  FOB here and very supportive.  Mom will work on supply and call for follow up if supply increases to evaluate milk transfer.  Initial feeding assessment:  Infant's oral assessment:  Variance  Baby has a short anterior lingual frenulum that does not seem to interfere with latch/feeding at this time  Positioning:  Football Left breast  LATCH documentation:  Latch:  2 = Grasps breast easily, tongue down, lips flanged, rhythmical sucking.  Audible swallowing:  1 = A few with stimulation  Type of nipple:  2 = Everted at rest and after stimulation  Comfort (Breast/Nipple):  2 = Soft / non-tender  Hold (Positioning):  1 = Assistance needed to correctly position infant at breast and maintain latch  LATCH score:  8  Attached assessment:  Deep  Lips flanged:  Yes.    Lips untucked:  Yes.    Suck assessment:  Nutritive  Pre-feed weight:  3360 g   Post-feed weight:  3362g  Amount transferred:  2 ml Amount supplemented:  60 ml

## 2016-11-23 ENCOUNTER — Ambulatory Visit (INDEPENDENT_AMBULATORY_CARE_PROVIDER_SITE_OTHER): Payer: BC Managed Care – PPO | Admitting: Neurology

## 2016-11-23 ENCOUNTER — Encounter: Payer: Self-pay | Admitting: Neurology

## 2016-11-23 VITALS — BP 116/71 | HR 76 | Ht 63.0 in | Wt 143.8 lb

## 2016-11-23 DIAGNOSIS — R51 Headache with orthostatic component, not elsewhere classified: Secondary | ICD-10-CM

## 2016-11-23 DIAGNOSIS — R29898 Other symptoms and signs involving the musculoskeletal system: Secondary | ICD-10-CM | POA: Diagnosis not present

## 2016-11-23 DIAGNOSIS — H539 Unspecified visual disturbance: Secondary | ICD-10-CM

## 2016-11-23 DIAGNOSIS — R030 Elevated blood-pressure reading, without diagnosis of hypertension: Secondary | ICD-10-CM

## 2016-11-23 DIAGNOSIS — R519 Headache, unspecified: Secondary | ICD-10-CM

## 2016-11-23 DIAGNOSIS — G08 Intracranial and intraspinal phlebitis and thrombophlebitis: Secondary | ICD-10-CM

## 2016-11-23 MED ORDER — PROPRANOLOL HCL 10 MG PO TABS: 10.0000 mg | ORAL_TABLET | Freq: Three times a day (TID) | ORAL | 6 refills | Status: DC

## 2016-11-23 NOTE — Progress Notes (Signed)
[facility]    Provider:  Dr Lucia Gaskins Referring Provider: Abner Greenspan, MD Primary Care Physician:  Abner Greenspan, MD  CC:  Headache  HPI:  Kimberly [name] is a 41 y.o. female here as a referral from Dr. Yetta Flock for headache for 4 months. She has a history of migraines in the past but this is different. She had an emergency c-section and her epidural slipped and was unresponsive that morning. It may have pierced the intrathecal space and she had to have Narcan but she never lost her pulse. Since deliversy she is having elevated blood pressure and occipital headaches. Started 3-4 weeks after delivery.She has daily headaches. Feels like someone has a vice around her head and she is going to explode, the pressure is severe and she has dizziness. No aura, no light sensitivity, no smell sensitivity no nausea. Her head feels like it is going to explode. Ocurs more after lunch but also wakes and is sometimes work supine. Worse positionally but continuous all day long. Can be a 9/10 on average 6-7. Nothing makes it better. No medication overuse, only take OTC 1-2x. She has blurry vision when she stands and she has to close her eyes. No known triggers. Started acutely and has progressively worsened now it is more frequent, more severe. No focal weakness. Grandmother had strokes.No neck pain.   Reviewed notes, labs and imaging from outside physicians, which showed:  She has a past medical history of migraines but this headache is different and started 3-4 weeks after delivery she is a 59-month-old at home. Headache is occipital, varies in severity, symptoms did not bother her until later in the day sometimes worse with laying down. Her blood pressure has been labile with diastolic goals 101 per last note. She was stopped on birth control pills and referred to neurology. She is status post C-section 07/05/2016. She noticed her heart is racing at home and blood pressure elevated 142/92.  However not I'll this time. She is little anxious about having a new baby. 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   Review of Systems: Patient complains of symptoms per HPI as well as the following symptoms: No chest pain or shortness of breath. Pertinent negatives per HPI. All others negative.   Social History   Social History  . Marital status: Married    Spouse name: N/A  . Number of children: 1  . Years of education: Master's   Occupational History  . Not on file.   Social History Main Topics  . Smoking status: Never Smoker  . Smokeless tobacco: Never Used  . Alcohol use No     Comment: rarely  . Drug use: No  . Sexual activity: Not on file   Other Topics Concern  . Not on file   Social History Narrative   Lives at home w/ her husband and daughter   Right-handed   Caffeine: 1 drink per day    Family History  Problem Relation Age of Onset  . Heart disease Father   . Diabetes Maternal Grandmother   . Thyroid disease Maternal Grandmother   . Stroke Maternal Grandmother   . Lung cancer Maternal Grandfather   . Heart attack Paternal Grandfather     Past Medical History:  Diagnosis Date  . AMA (advanced maternal age) primigravida 35+   . 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 Headache   . History of endometriosis   . History of kidney stones   .  Nasal congestion 07/16/2011  . Sinus infection 07/16/2011   will finish antibiotic 07/18/2011    Past Surgical History:  Procedure Laterality Date  . ablation of endometriosis  2016  . CESAREAN SECTION N/A 08/02/2016   Procedure: CESAREAN SECTION;  Surgeon: Harold Hedge, MD;  [facility]: [facility];  Service: Obstetrics;  Laterality: N/A;  . KNEE ARTHROSCOPY  07/22/2011   Procedure: ARTHROSCOPY KNEE;  Surgeon: Loreta Ave, MD;  Location: [facility];  Service: Orthopedics;  Laterality: Right;  with lateral release and with debridement/shaving  (chondroplasty)  . SKIN GRAFT SPLIT THICKNESS LEG / FOOT  2008   right foot    Current Outpatient Prescriptions  Medication Sig Dispense Refill  . acetaminophen (TYLENOL) 325 MG tablet Take 650 mg by mouth every 6 (six) hours as needed.    Marland Kitchen ibuprofen (ADVIL,MOTRIN) 600 MG tablet Take 1 tablet (600 mg total) by mouth every 6 (six) hours as needed for moderate pain. 30 tablet 1  . propranolol (INDERAL) 10 MG tablet Take 1 tablet (10 mg total) by mouth 3 (three) times daily. 90 tablet 6   No current facility-administered medications for this visit.     Allergies as of 11/23/2016 - Review Complete 11/23/2016  Allergen Reaction Noted  . Scopolamine Other (See Comments) 08/02/2016  . Hydrocodone Nausea And Vomiting 07/16/2011    Vitals: BP 116/71   Pulse 76   Ht 5\' 3"  (1.6 m)   Wt 143 lb 12.8 oz (65.2 kg)   BMI 25.47 kg/m  Last Weight:  Wt Readings from Last 1 Encounters:  11/23/16 143 lb 12.8 oz (65.2 kg)   Last Height:   Ht Readings from Last 1 Encounters:  11/23/16 5\' 3"  (1.6 m)    Physical exam: Exam: Gen: NAD, conversant, well nourised, well groomed                     CV: RRR, no MRG. No Carotid Bruits. No peripheral edema, warm, nontender Eyes: Conjunctivae clear without exudates or hemorrhage  Neuro: Detailed Neurologic Exam  Speech:    Speech is normal; fluent and spontaneous with normal comprehension.  Cognition:    The patient is oriented to person, place, and time;     recent and remote memory intact;     language fluent;     normal attention, concentration,     fund of knowledge Cranial Nerves:    The pupils are equal, round, and reactive to light. The fundi are normal and spontaneous venous pulsations are present. Visual fields are full to finger confrontation. Extraocular movements are intact. Trigeminal sensation is intact and the muscles of mastication are normal. The face is symmetric. The palate elevates in the midline. Hearing intact. Voice is  normal. Shoulder shrug is normal. The tongue has normal motion without fasciculations.   Coordination:    Normal finger to nose and heel to shin. Normal rapid alternating movements.   Gait:    Heel-toe and tandem gait are normal.   Motor Observation:    No asymmetry, no atrophy, and no involuntary movements noted. Tone:    Normal muscle tone.    Posture:    Posture is normal. normal erect    Strength: Proximal right LE weakness otherwise strength is V/V in the upper and lower limbs.      Sensation: intact to LT     Reflex Exam:  DTR's:    Deep tendon reflexes in the upper and lower extremities are normal bilaterally.  Toes:    The toes are downgoing bilaterally.   Clonus:    Clonus is absent.      Assessment/Plan:  This is a 41 year old patient who has intractable positional headaches since giving birth 4 months ago and after C-section epidural which may have pierced the intrathecal space. She has daily headaches. The headaches are positional. Given the positional nature, blurred vision and postpartum timing in the setting of epidural that pierced the intrathecal space need to evaluate for infectious or inflammatory causes, mass lesion in the brain, intracranial hypotension or other causes of headache. Also given headache and focal weakness recommend an MRV of the head to rule out cerebral venous thrombosis.  MRI brain w/wo contrast MRV head  Orders Placed This Encounter  Procedures  . MR BRAIN W WO CONTRAST  . MR MRV HEAD WO CM  . Basic Metabolic Panel     Naomie DeanAntonia Antanasia Kaczynski, MD  [facility] [address] [place], [place] [id number]  Phone [phone] Fax [phone]

## 2016-11-23 NOTE — Patient Instructions (Addendum)
Remember to drink plenty of fluid, eat healthy meals and do not skip any meals. Try to eat protein with a every meal and eat a healthy snack such as fruit or nuts in between meals. Try to keep a regular sleep-wake schedule and try to exercise daily, particularly in the form of walking, 20-30 minutes a day, if you can.   As far as your medications are concerned, I would like to suggest: Start with propranolol twice a day   As far as diagnostic testing: MRI of the brain and MRV of the head  I would like to see you back in 8 weeks, sooner if we need to. Please call us with any interim questions, concerns, problems, updates or refill requests.   Our phone number is [phone]. We also have an after hours call service for urgent matters and there is a physician on-call for urgent questions. For any emergencies you know to call 911 or go to the nearest emergency room  To prevent or relieve headaches, try the following: Cool Compress. Lie down and place a cool compress on your head.  Avoid headache triggers. If certain foods or odors seem to have triggered your migraines in the past, avoid them. A headache diary might help you identify triggers.  Include physical activity in your daily routine. Try a daily walk or other moderate aerobic exercise.  Manage stress. Find healthy ways to cope with the stressors, such as delegating tasks on your to-do list.  Practice relaxation techniques. Try deep breathing, yoga, massage and visualization.  Eat regularly. Eating regularly scheduled meals and maintaining a healthy diet might help prevent headaches. Also, drink plenty of fluids.  Follow a regular sleep schedule. Sleep deprivation might contribute to headaches Consider biofeedback. With this mind-body technique, you learn to control certain bodily functions - such as muscle tension, heart rate and blood pressure - to prevent headaches or reduce headache pain.    Proceed to emergency room if you experience  new or worsening symptoms or symptoms do not resolve, if you have new neurologic symptoms or if headache is severe, or for any concerning symptom.    Propranolol tablets What is this medicine? PROPRANOLOL (proe PRAN oh lole) is a beta-blocker. Beta-blockers reduce the workload on the heart and help it to beat more regularly. This medicine is used to treat high blood pressure, to control irregular heart rhythms (arrhythmias) and to relieve chest pain caused by angina. It may also be helpful after a heart attack. This medicine is also used to prevent migraine headaches, relieve uncontrollable shaking (tremors), and help certain problems related to the thyroid gland and adrenal gland. This medicine may be used for other purposes; ask your health care provider or pharmacist if you have questions. COMMON BRAND NAME(S): Inderal What should I tell my health care provider before I take this medicine? They need to know if you have any of these conditions: -circulation problems or blood vessel disease -diabetes -history of heart attack or heart disease, vasospastic angina -kidney disease -liver disease -lung or breathing disease, like asthma or emphysema -pheochromocytoma -slow heart rate -thyroid disease -an unusual or allergic reaction to propranolol, other beta-blockers, medicines, foods, dyes, or preservatives -pregnant or trying to get pregnant -breast-feeding How should I use this medicine? Take this medicine by mouth with a glass of water. Follow the directions on the prescription label. Take your doses at regular intervals. Do not take your medicine more often than directed. Do not stop taking except on your the  advice of your doctor or health care professional. Talk to your pediatrician regarding the use of this medicine in children. Special care may be needed. Overdosage: If you think you have taken too much of this medicine contact a poison control center or emergency room at once. NOTE:  This medicine is only for you. Do not share this medicine with others. What if I miss a dose? If you miss a dose, take it as soon as you can. If it is almost time for your next dose, take only that dose. Do not take double or extra doses. What may interact with this medicine? Do not take this medicine with any of the following medications: -feverfew -phenothiazines like chlorpromazine, mesoridazine, prochlorperazine, thioridazine This medicine may also interact with the following medications: -aluminum hydroxide gel -antipyrine -antiviral medicines for HIV or AIDS -barbiturates like phenobarbital -certain medicines for blood pressure, heart disease, irregular heart beat -cimetidine -ciprofloxacin -diazepam -fluconazole -haloperidol -isoniazid -medicines for cholesterol like cholestyramine or colestipol -medicines for mental depression -medicines for migraine headache like almotriptan, eletriptan, frovatriptan, naratriptan, rizatriptan, sumatriptan, zolmitriptan -NSAIDs, medicines for pain and inflammation, like ibuprofen or naproxen -phenytoin -rifampin -teniposide -theophylline -thyroid medicines -tolbutamide -warfarin -zileuton This list may not describe all possible interactions. Give your health care provider a list of all the medicines, herbs, non-prescription drugs, or dietary supplements you use. Also tell them if you smoke, drink alcohol, or use illegal drugs. Some items may interact with your medicine. What should I watch for while using this medicine? Visit your doctor or health care professional for regular check ups. Check your blood pressure and pulse rate regularly. Ask your health care professional what your blood pressure and pulse rate should be, and when you should contact them. You may get drowsy or dizzy. Do not drive, use machinery, or do anything that needs mental alertness until you know how this drug affects you. Do not stand or sit up quickly, especially if  you are an older patient. This reduces the risk of dizzy or fainting spells. Alcohol can make you more drowsy and dizzy. Avoid alcoholic drinks. This medicine can affect blood sugar levels. If you have diabetes, check with your doctor or health care professional before you change your diet or the dose of your diabetic medicine. Do not treat yourself for coughs, colds, or pain while you are taking this medicine without asking your doctor or health care professional for advice. Some ingredients may increase your blood pressure. What side effects may I notice from receiving this medicine? Side effects that you should report to your doctor or health care professional as soon as possible: -allergic reactions like skin rash, itching or hives, swelling of the face, lips, or tongue -breathing problems -changes in blood sugar -cold hands or feet -difficulty sleeping, nightmares -dry peeling skin -hallucinations -muscle cramps or weakness -slow heart rate -swelling of the legs and ankles -vomiting Side effects that usually do not require medical attention (report to your doctor or health care professional if they continue or are bothersome): -change in sex drive or performance -diarrhea -dry sore eyes -hair loss -nausea -weak or tired This list may not describe all possible side effects. Call your doctor for medical advice about side effects. You may report side effects to FDA at 1-800-FDA-1088. Where should I keep my medicine? Keep out of the reach of children. Store at room temperature between 15 and 30 degrees C (59 and 86 degrees F). Protect from light. Throw away any unused medicine after the expiration  date. NOTE: This sheet is a summary. It may not cover all possible information. If you have questions about this medicine, talk to your doctor, pharmacist, or health care provider.  2018 Elsevier/Gold Standard (2013-02-23 14:51:53)

## 2016-11-24 ENCOUNTER — Telehealth: Payer: Self-pay | Admitting: *Deleted

## 2016-11-24 LAB — BASIC METABOLIC PANEL
BUN / CREAT RATIO: 9 (ref 9–23)
BUN: 7 mg/dL (ref 6–20)
CO2: 27 mmol/L (ref 18–29)
CREATININE: 0.75 mg/dL (ref 0.57–1.00)
Calcium: 9.8 mg/dL (ref 8.7–10.2)
Chloride: 102 mmol/L (ref 96–106)
GFR calc non Af Amer: 101 mL/min/{1.73_m2} (ref >59)
GFR, EST AFRICAN AMERICAN: 116 mL/min/{1.73_m2} (ref >59)
GLUCOSE: 99 mg/dL (ref 65–99)
Potassium: 4.8 mmol/L (ref 3.5–5.2)
SODIUM: 141 mmol/L (ref 134–144)
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
## 2016-11-26 ENCOUNTER — Encounter: Payer: Self-pay | Admitting: Neurology

## 2016-11-27 ENCOUNTER — Encounter: Payer: Self-pay | Admitting: Neurology

## 2016-12-08 ENCOUNTER — Ambulatory Visit
Admission: RE | Admit: 2016-12-08 | Discharge: 2016-12-08 | Disposition: A | Discharge status: Home / Self Care | Payer: BC Managed Care – PPO | Source: Ambulatory Visit | Attending: Neurology | Admitting: Neurology

## 2016-12-08 DIAGNOSIS — R51 Headache with orthostatic component, not elsewhere classified: Secondary | ICD-10-CM

## 2016-12-08 DIAGNOSIS — H539 Unspecified visual disturbance: Secondary | ICD-10-CM

## 2016-12-08 DIAGNOSIS — R030 Elevated blood-pressure reading, without diagnosis of hypertension: Secondary | ICD-10-CM

## 2016-12-08 DIAGNOSIS — G08 Intracranial and intraspinal phlebitis and thrombophlebitis: Secondary | ICD-10-CM

## 2016-12-08 DIAGNOSIS — G8929 Other chronic pain: Secondary | ICD-10-CM

## 2016-12-08 DIAGNOSIS — R29898 Other symptoms and signs involving the musculoskeletal system: Secondary | ICD-10-CM

## 2016-12-08 MED ORDER — GADOBENATE DIMEGLUMINE 529 MG/ML IV SOLN
15.0000 mL | Freq: Once | INTRAVENOUS | Status: AC | PRN
  Administered 2016-12-08: 13 mL via INTRAVENOUS

## 2016-12-10 ENCOUNTER — Encounter: Payer: Self-pay | Admitting: Neurology

## 2016-12-13 ENCOUNTER — Telehealth: Payer: Self-pay | Admitting: Neurology

## 2016-12-13 ENCOUNTER — Other Ambulatory Visit: Payer: Self-pay | Admitting: Neurology

## 2016-12-13 ENCOUNTER — Telehealth: Payer: Self-pay | Admitting: Cardiology

## 2016-12-13 ENCOUNTER — Encounter: Payer: Self-pay | Admitting: Cardiology

## 2016-12-13 DIAGNOSIS — D6859 Other primary thrombophilia: Secondary | ICD-10-CM

## 2016-12-13 DIAGNOSIS — I669 Occlusion and stenosis of unspecified cerebral artery: Secondary | ICD-10-CM

## 2016-12-13 DIAGNOSIS — G08 Intracranial and intraspinal phlebitis and thrombophlebitis: Secondary | ICD-10-CM

## 2016-12-13 MED ORDER — ACETAZOLAMIDE 250 MG PO TABS: 250.0000 mg | ORAL_TABLET | Freq: Two times a day (BID) | ORAL | 12 refills | Status: DC

## 2016-12-13 NOTE — Telephone Encounter (Signed)
New order entered for CTA head w/ comments: CT venogram, OK for contrast.

## 2016-12-13 NOTE — Telephone Encounter (Signed)
Informed pt I would have Dr. Elberta Fortisamnitz review chart and we would call her w/ recommendations and/or sooner appt. She thanks me for calling

## 2016-12-13 NOTE — Addendum Note (Signed)
Addended by: Donnelly AngelicaHOGAN, Dylan Monforte L on: 12/13/2016 05:05 PM   Modules accepted: Orders

## 2016-12-13 NOTE — Telephone Encounter (Signed)
New Message  Pt call requesting to speak with RN about getting a sooner appt with only Dr. Elberta Fortisamnitz before next available on 7/3. Pt states she went to a neurologist and was told she has some blood clots. Please call back to discuss

## 2016-12-13 NOTE — Telephone Encounter (Signed)
MRI brain was normal for age but her MRV was abnormal. It shows one of th veins is either smaller (could be born with this, normal variant) or it is possibie this happened several months ago because the most common symptoms of this is headache. Needs further workup including hypercoag panel.     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 Patent superior sagittal sinus and deep cerebral veins and sinuses.

## 2016-12-13 NOTE — Progress Notes (Signed)
[name] is a 41 y.o. female with history of migraine headaches, obesity, anemia and family history of coagulopathy, with left-sided numbness. MRi of the brain showed ... concerning for cerebral vasoconstriction syndromes, Which may include vasculitidies (primary CNS angitis, giant cell arteritis, granulomatous disease, other systemic rheumatologic diseases), hypercoagulable states (autoimmune or paraneoplastic), infectious processes (meningo-encephalitis), subarachnoid hemorrhage, hemoglobinopathies, as well a group of conditions associated with so called reversible cerebral vasoconstriction syndrome (RCVS). Extensive workup for above causes revealed low protein s activity   antithrombin 3, protein C activity and  total, protein S activity, protein S total, lupus anticoagulant profile, beta 2 glycoprotein, homocysteine, factor V Leiden, prothrombin gene mutation, cardiolipin antibodies,sed rate,hemoglobinopathy, , tsh, pan-anca, complement total, c3 and c4 complement, lupus anticoagulant, homocysteine,  sickle cell screen,ace, ror, hiv, sickle cell screen    Dr. Xu:   Actually, for arterial stroke, check lupus anticoagulant, beta 2 glycoprotein antibodies, homocysteine, cardiolipin antibodies, sickle cell screen if AA, metastasis tumor work up if indicated.  For venous stroke, besides above, check antithrombin 3, protein C and protein S, factor V leiden, prothrombin gene mutation  For vasculitis work up, C3, C4, ANA, p-ANCA, c-ANCA, dsDNA, ESR, CRP, RF, RPR, HIV, SSA, SSB, smith antibody. However, we do ANA, ESR, CRP, RPR, HIV as screen tests. If positive, will do the rest.    

## 2016-12-13 NOTE — Telephone Encounter (Signed)
[facility] because I needed her help on the CPT code for the CT Venogram. She informed me that she spoke to CT tech and for Dr. Lucia GaskinsAhern to put the CT Angio Head order instead of the CT Venogram and on the order comment notes put that it is a CT venogram test and also put that she is okay with the CT contrast.

## 2016-12-13 NOTE — Telephone Encounter (Signed)
Perfect thank you!

## 2016-12-13 NOTE — Telephone Encounter (Signed)
I spoke with patient. Going to orde a CTV to see if we can visualize the sinus better to see if this is hypoplatic or chronic clot. Will order hypercoag panel. She will start ASA. Also will start diamox low dose, discussed side effects esp kidney stones stay well hydrated.   Irving Burtonmily, patient has ha CT contrast before and did fine. But the MRi contrast gave her hives. Can you ensure with GI that she can have the CT contrast? Thanks!

## 2016-12-14 ENCOUNTER — Telehealth: Payer: Self-pay | Admitting: *Deleted

## 2016-12-14 NOTE — Telephone Encounter (Signed)
Called and LVM about normal MRI brain per AA,MD note. Gave [facility] phone number if she has further questions or concerns.

## 2016-12-14 NOTE — Telephone Encounter (Signed)
Dr. Elberta Fortisamnitz reviewed chart.   Pt prefers to follow up w/ Dr. Elberta Fortisamnitz as it is almost her yearly check for PVCs. Scheduled pt to be seen next week for her yearly follow up, per her request.

## 2016-12-14 NOTE — Telephone Encounter (Signed)
I was able to get it approved and I sent the order to [facility].

## 2016-12-14 NOTE — Telephone Encounter (Signed)
-----   Message from Anson FretAntonia B Ahern, MD sent at 12/13/2016 10:54 AM EDT ----- MRI brain normal for age thanks

## 2016-12-15 ENCOUNTER — Encounter: Payer: Self-pay | Admitting: Neurology

## 2016-12-15 ENCOUNTER — Ambulatory Visit
Admission: RE | Admit: 2016-12-15 | Discharge: 2016-12-15 | Disposition: A | Discharge status: Home / Self Care | Payer: BC Managed Care – PPO | Source: Ambulatory Visit | Attending: Neurology | Admitting: Neurology

## 2016-12-15 DIAGNOSIS — I669 Occlusion and stenosis of unspecified cerebral artery: Secondary | ICD-10-CM

## 2016-12-15 MED ORDER — IOPAMIDOL (ISOVUE-370) INJECTION 76%: 75.0000 mL | Freq: Once | INTRAVENOUS | Status: DC | PRN

## 2016-12-17 ENCOUNTER — Other Ambulatory Visit (INDEPENDENT_AMBULATORY_CARE_PROVIDER_SITE_OTHER): Payer: Self-pay

## 2016-12-17 DIAGNOSIS — G08 Intracranial and intraspinal phlebitis and thrombophlebitis: Secondary | ICD-10-CM

## 2016-12-17 DIAGNOSIS — Z0289 Encounter for other administrative examinations: Secondary | ICD-10-CM

## 2016-12-17 DIAGNOSIS — D6859 Other primary thrombophilia: Secondary | ICD-10-CM

## 2016-12-22 ENCOUNTER — Ambulatory Visit (INDEPENDENT_AMBULATORY_CARE_PROVIDER_SITE_OTHER): Payer: BC Managed Care – PPO | Admitting: Cardiology

## 2016-12-22 ENCOUNTER — Encounter: Payer: Self-pay | Admitting: Cardiology

## 2016-12-22 VITALS — BP 110/70 | HR 59 | Ht 63.0 in | Wt 145.0 lb

## 2016-12-22 DIAGNOSIS — I493 Ventricular premature depolarization: Secondary | ICD-10-CM | POA: Diagnosis not present

## 2016-12-22 DIAGNOSIS — R002 Palpitations: Secondary | ICD-10-CM

## 2016-12-22 NOTE — Telephone Encounter (Signed)
Pt said on Monday of this week she received a phone message from Dr Lucia GaskinsAhern re: CTV, pt asking for a call back to discuss

## 2016-12-22 NOTE — Progress Notes (Addendum)
Electrophysiology Office Note   Date:  12/22/2016   ID:  Kimberly [name], DOB Nov 12, 1975, MRN [id number]  PCP:  Abner Greenspan, MD  Primary Electrophysiologist:  [name], MD    Chief Complaint  Patient presents with  . Follow-up    PVC's     History of Present Illness: Kimberly [name] is a 41 y.o. female who presents today for electrophysiology evaluation.   She had a difficult labor and delivery for her child. Around the time of delivery, she was having quite a bit more PVCs. This continued up to a month after delivery. Her PVCs went away after a month, but she did develop severe positional headaches. She had an epidural that apparently went into the thecal space. 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 CT scanning showed no abnormalities in the left transverse sinus. Her headaches occur every day. They're associated with just about any position. She has not tried any medications for them at this point.  Today, denies symptoms of palpitations, chest pain, shortness of breath, orthopnea, PND, lower extremity edema, claudication, dizziness, presyncope, syncope, bleeding.   Past Medical History:  Diagnosis Date  . AMA (advanced maternal age) primigravida 35+   . 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 Headache   . History of endometriosis   . History of kidney stones   . Nasal congestion 07/16/2011  . Sinus infection 07/16/2011   Dyna Figuereo finish antibiotic 07/18/2011   Past Surgical History:  Procedure Laterality Date  . ablation of endometriosis  2016  . CESAREAN SECTION N/A 08/02/2016   Procedure: CESAREAN SECTION;  Surgeon: Harold Hedge, MD;  Location: [facility];  Service: Obstetrics;  Laterality: N/A;  . KNEE ARTHROSCOPY  07/22/2011   Procedure: ARTHROSCOPY KNEE;  Surgeon: Loreta Ave,  MD;  Location: [facility];  Service: Orthopedics;  Laterality: Right;  with lateral release and with debridement/shaving (chondroplasty)  . SKIN GRAFT SPLIT THICKNESS LEG / FOOT  2008   right foot     Current Outpatient Prescriptions  Medication Sig Dispense Refill  . acetaminophen (TYLENOL) 325 MG tablet Take 650 mg by mouth every 6 (six) hours as needed.    Marland Kitchen ibuprofen (ADVIL,MOTRIN) 600 MG tablet Take 1 tablet (600 mg total) by mouth every 6 (six) hours as needed for moderate pain. 30 tablet 1  . propranolol (INDERAL) 10 MG tablet Take 1 tablet (10 mg total) by mouth 3 (three) times daily. 90 tablet 6   No current facility-administered medications for this visit.     Allergies:   Diamox [acetazolamide]; Scopolamine; Hydrocodone; and Multihance [gadobenate]   Social History:  The patient  reports that she has never smoked. She has never used smokeless tobacco. She reports that she does not drink alcohol or use drugs.   Family History:  The patient's family history includes Diabetes in her maternal grandmother; Heart attack in her paternal grandfather; Heart disease in her father; Lung cancer in her maternal grandfather; Stroke in her maternal grandmother; Thyroid disease in her maternal grandmother.    ROS:  Please see the history of present illness.   Otherwise, review of systems is positive for Headaches.   All other systems are reviewed and negative.     PHYSICAL EXAM: VS:  BP 110/70   Pulse (!) 59   Ht 5\' 3"  (1.6 m)   Wt  145 lb (65.8 kg)   BMI 25.69 kg/m  , BMI Body mass index is 25.69 kg/m. GEN: Well nourished, well developed, in no acute distress  HEENT: normal  Neck: no JVD, carotid bruits, or masses Cardiac: RRR; no murmurs, rubs, or gallops,no edema  Respiratory:  clear to auscultation bilaterally, normal work of breathing GI: soft, nontender, nondistended, + BS MS: no deformity or atrophy  Skin: warm and dry Neuro:  Strength and sensation are  intact Psych: euthymic mood, full affect  EKG:  EKG is ordered today. Personal review of the ekg ordered shows sinus rhythm, rate 59  Recent Labs: 08/02/2016: ALT 20 08/05/2016: Hemoglobin 11.4; Platelets 143 11/23/2016: BUN 7; Creatinine, Ser 0.75; Potassium 4.8; Sodium 141    Lipid Panel  No results found for: CHOL, TRIG, HDL, CHOLHDL, VLDL, LDLCALC, LDLDIRECT   Wt Readings from Last 3 Encounters:  12/22/16 145 lb (65.8 kg)  11/23/16 143 lb 12.8 oz (65.2 kg)  08/02/16 175 lb (79.4 kg)      Other studies Reviewed: Additional studies/ records that were reviewed today include: Holter 01/26/16 - personally reviewed Minimum: 67 bpm at 3:34 Mean: 84 bpm Maximum: 131 bpm at 12:39 PM 0% atrial fibrillation 6.22% PVCs 0.1% APCs Sinus rhythm   ASSESSMENT AND PLAN:  1.  PVCs: Currently her PVCs are significant only reduced. She has had much less since giving birth. She previously did have symptoms of palpitations, though she has not had any other month since her C-section. Due to that, we'll continue to monitor. No medications at this time.   2. Headaches: Unclear as to the cause of her headaches, though it does not seem that there is a cardiac nature to them. She is no longer having high burden of PVCs. There is no rhythm abnormality that could explain her headaches. Follow-up with neurology.    Current medicines are reviewed at length with the patient today.   The patient does not have concerns regarding her medicines.  The following changes were made today:  none  Labs/ tests ordered today include:  Orders Placed This Encounter  Procedures  . EKG 12-Lead     Disposition:   FU with [name] PRN  Signed, Terriann Difonzo Jorja LoaMartin Tawnia Schirm, MD  12/22/2016 9:11 AM     [facility] [address] [place] [address] [phone] (office) [phone] (fax)

## 2016-12-22 NOTE — Patient Instructions (Signed)
Medication Instructions:  Your physician recommends that you continue on your current medications as directed. Please refer to the Current Medication list given to you today.  * If you need a refill on your cardiac medications before your next appointment, please call your pharmacy.   Labwork: None ordered  Testing/Procedures: None ordered  Follow-Up: No follow up is needed at this time with Dr. Camnitz.  He will see you on an as needed basis.   Thank you for choosing [facility]!!   Sherri Price, RN [phone]     

## 2016-12-23 ENCOUNTER — Encounter: Payer: Self-pay | Admitting: *Deleted

## 2016-12-23 DIAGNOSIS — Z87442 Personal history of urinary calculi: Secondary | ICD-10-CM | POA: Insufficient documentation

## 2016-12-23 LAB — CARDIOLIPIN ANTIBODIES, IGG, IGM, IGA
Anticardiolipin IgA: <9 APL U/mL (ref 0–11)
Anticardiolipin IgG: <9 GPL U/mL (ref 0–14)
Anticardiolipin IgM: <9 MPL U/mL (ref 0–12)

## 2016-12-23 LAB — LUPUS ANTICOAGULANT
Dilute Viper Venom Time: 31 s (ref 0.0–47.0)
PTT Lupus Anticoagulant: 36.7 s (ref 0.0–51.9)
THROMBIN TIME: 17.2 s (ref 0.0–23.0)
dPT Confirm Ratio: 0.96 Ratio (ref 0.00–1.40)
dPT: 38.4 s (ref 0.0–55.0)

## 2016-12-23 LAB — PROTEIN S ACTIVITY: PROTEIN S ACTIVITY: 109 % (ref 63–140)

## 2016-12-23 LAB — PROTEIN C ACTIVITY: PROTEIN C ACTIVITY: 120 % (ref 73–180)

## 2016-12-23 LAB — BETA-2-GLYCOPROTEIN I ABS, IGG/M/A: Beta-2 Glyco I IgG: <9 GPI IgG units (ref 0–20)

## 2016-12-23 LAB — PROTEIN C, TOTAL: PROTEIN C ANTIGEN: 69 % (ref 60–150)

## 2016-12-23 LAB — PROTHROMBIN GENE MUTATION

## 2016-12-23 LAB — ANTITHROMBIN III: AntiThromb III Func: 103 % (ref 75–135)

## 2016-12-23 LAB — FACTOR V LEIDEN

## 2016-12-23 LAB — HOMOCYSTEINE: Homocysteine: 8.9 umol/L (ref 0.0–15.0)

## 2016-12-23 LAB — SEDIMENTATION RATE: SED RATE: 2 mm/h (ref 0–32)

## 2016-12-23 LAB — PROTEIN S, TOTAL: PROTEIN S AG TOTAL: 72 % (ref 60–150)

## 2016-12-23 NOTE — Telephone Encounter (Signed)
Patient aware of results and verbalized understanding.  She is agreeable to try a prophylactic medication for her headaches but declined topiramate due to her history of kidney stones.  Her history of kidney stones was not reflected in her problem list but has been added.  Also, she is inquiring if a blood patch would be helpful.

## 2016-12-23 NOTE — Telephone Encounter (Signed)
Kimberly [name], MRV of the head showed a possible venous thrombosis but follow up CTV which is more sensitive was negative so I think the MRI finding was artifact. And all her labs for hypercoag disorders are negative so I think she is safe and can stop the aspirin thanks. We will have to treat her headaches, I thought I started Topiramate for her. Would you call and discuss? thanks

## 2016-12-23 NOTE — Telephone Encounter (Signed)
Kimberly PancoastMary [name], I called patient and left a message. We can do a couple things. We could try a blood patch, they don;t usually cause problems and could help. If that doesn;t work we may want to go forward with a CT myelogram which is the best way to look for csf leaks in the lumbar spine. In the meantime we could try another medication, Nortriptyline for example. Please give her a call.

## 2016-12-23 NOTE — Telephone Encounter (Signed)
Left message for a return call

## 2016-12-24 NOTE — Telephone Encounter (Signed)
Patient called office returning RN's call.  Please call °

## 2016-12-24 NOTE — Telephone Encounter (Signed)
LVM repeating Dr Trevor MaceAhern's VM and recommendations. Advised we close at 12 today, left office number.

## 2016-12-24 NOTE — Telephone Encounter (Signed)
Called patient and discussed Dr Trevor MaceAhern's recommendations. The patient stated she would really prefer to see Dr Lucia GaskinsAhern and discuss in person. She stated she would like to discuss MRI, MRV results as well. Scheduled her for next Wed, advised she arrive 20-30 min early to check in. Patient verbalized understanding, appreciation.

## 2016-12-29 ENCOUNTER — Ambulatory Visit (INDEPENDENT_AMBULATORY_CARE_PROVIDER_SITE_OTHER): Payer: BC Managed Care – PPO | Admitting: Neurology

## 2016-12-29 ENCOUNTER — Encounter: Payer: Self-pay | Admitting: Neurology

## 2016-12-29 ENCOUNTER — Telehealth: Payer: Self-pay | Admitting: Neurology

## 2016-12-29 VITALS — BP 96/62 | HR 63 | Ht 63.0 in | Wt 146.0 lb

## 2016-12-29 DIAGNOSIS — G9600 Cerebrospinal fluid leak, unspecified: Secondary | ICD-10-CM

## 2016-12-29 NOTE — Patient Instructions (Signed)
Remember to drink plenty of fluid, eat healthy meals and do not skip any meals. Try to eat protein with a every meal and eat a healthy snack such as fruit or nuts in between meals. Try to keep a regular sleep-wake schedule and try to exercise daily, particularly in the form of walking, 20-30 minutes a day, if you can.   As far as diagnostic testing: blood patch then CT myelogram  Our phone number is [phone]. We also have an after hours call service for urgent matters and there is a physician on-call for urgent questions. For any emergencies you know to call 911 or go to the nearest emergency room

## 2016-12-29 NOTE — Telephone Encounter (Signed)
Patient had  an apt. Today with Dr. Lucia GaskinsAhern. I tried to get patient scheduled for her Blood Patch today at [facility] . Patient relayed she really needed to wait until her husband was back in town next week to have. I relayed to Patient if her headache  gets worse please call the office and let Dr. Lucia GaskinsAhern know . Patient relayed she would and she understood . Patient will have her blood patch Monday of next week . Jenel LucksRoberta from [facility] will go over details.

## 2016-12-29 NOTE — Progress Notes (Signed)
[facility]    Provider:  Dr Lucia Gaskins Referring Provider: Abner Greenspan, MD Primary Care Physician:  Abner Greenspan, MD  CC:  Headache  Interval history 12/29/2016: Patient returns for chronic daily headaches since epidural during childbirth. MRI of the brain was unremarkable. MRV of the head showed possible venous sinus thrombosis however CTV was normal. She does have a history of migraines in the past that these are different, more occipital like someone has a vice around her head, pressure severe and dizziness, no migrainous features such as light sensitivity smell sensitivity or nausea, worse supine but continuous all day. Patient's blood pressure was elevated that we started her on a small dose of propranolol to see if this would help with her headaches.   HPI:  Kimberly [name] is a 41 y.o. female here as a referral from Dr. Yetta Flock for headache for 4 months. She has a history of migraines in the past but this is different. She had an emergency c-section and her epidural slipped and was unresponsive that morning. It may have pierced the intrathecal space and she had to have Narcan but she never lost her pulse. Since deliversy she is having elevated blood pressure and occipital headaches. Started 3-4 weeks after delivery.She has daily headaches. Feels like someone has a vice around her head and she is going to explode, the pressure is severe and she has dizziness. No aura, no light sensitivity, no smell sensitivity no nausea. Her head feels like it is going to explode. Ocurs more after lunch but also wakes and is sometimes work supine. Worse positionally but continuous all day long. Can be a 9/10 on average 6-7. Nothing makes it better. No medication overuse, only take OTC 1-2x. She has blurry vision when she stands and she has to close her eyes. No known triggers. Started acutely and has progressively worsened now it is more frequent, more severe. No focal weakness. Grandmother  had strokes.No neck pain.   Reviewed notes, labs and imaging from outside physicians, which showed:  She has a past medical history of migraines but this headache is different and started 3-4 weeks after delivery she is a 49-month-old at home. Headache is occipital, varies in severity, symptoms did not bother her until later in the day sometimes worse with laying down. Her blood pressure has been labile with diastolic goals 101 per last note. She was stopped on birth control pills and referred to neurology. She is status post C-section 07/05/2016. She noticed her heart is racing at home and blood pressure elevated 142/92. However not I'll this time. She is little anxious about having a new baby. 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   Review of Systems: Patient complains of symptoms per HPI as well as the following symptoms: no fever, no rash, no meningismus. Pertinent negatives and positives per HPI. All others negative.   Social History   Social History  . Marital status: Married    Spouse name: N/A  . Number of children: 1  . Years of education: Master's   Occupational History  . Not on file.   Social History Main Topics  . Smoking status: Never Smoker  . Smokeless tobacco: Never Used  . Alcohol use No     Comment: rarely  . Drug use: No  . Sexual activity: Not on file   Other Topics Concern  . Not on file   Social History Narrative   Lives at home w/ her husband and daughter  Right-handed   Caffeine: 1 drink per day    Family History  Problem Relation Age of Onset  . Heart disease Father   . Diabetes Maternal Grandmother   . Thyroid disease Maternal Grandmother   . Stroke Maternal Grandmother   . Lung cancer Maternal Grandfather   . Heart attack Paternal Grandfather     Past Medical History:  Diagnosis Date  . AMA (advanced maternal age) primigravida 35+   . 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 Headache   .  History of endometriosis   . History of kidney stones   . Nasal congestion 07/16/2011  . Sinus infection 07/16/2011   will finish antibiotic 07/18/2011    Past Surgical History:  Procedure Laterality Date  . ablation of endometriosis  2016  . CESAREAN SECTION N/A 08/02/2016   Procedure: CESAREAN SECTION;  Surgeon: Harold HedgeJames Tomblin, MD;  [facility];  Service: Obstetrics;  Laterality: N/A;  . KNEE ARTHROSCOPY  07/22/2011   Procedure: ARTHROSCOPY KNEE;  Surgeon: Loreta Aveaniel F Murphy, MD;  Location: [facility];  Service: Orthopedics;  Laterality: Right;  with lateral release and with debridement/shaving (chondroplasty)  . SKIN GRAFT SPLIT THICKNESS LEG / FOOT  2008   right foot    Current Outpatient Prescriptions  Medication Sig Dispense Refill  . acetaminophen (TYLENOL) 325 MG tablet Take 650 mg by mouth every 6 (six) hours as needed.    Marland Kitchen. ibuprofen (ADVIL,MOTRIN) 600 MG tablet Take 1 tablet (600 mg total) by mouth every 6 (six) hours as needed for moderate pain. 30 tablet 1  . propranolol (INDERAL) 10 MG tablet Take 1 tablet (10 mg total) by mouth 3 (three) times daily. 90 tablet 6   No current facility-administered medications for this visit.     Allergies as of 12/29/2016 - Review Complete 12/29/2016  Allergen Reaction Noted  . Diamox [acetazolamide] Other (See Comments) 12/22/2016  . Scopolamine Other (See Comments) 08/02/2016  . Hydrocodone Nausea And Vomiting 07/16/2011  . Multihance [gadobenate] Hives 12/09/2016    Vitals: BP 96/62   Pulse 63   Ht 5\' 3"  (1.6 m)   Wt 146 lb (66.2 kg)   BMI 25.86 kg/m  Last Weight:  Wt Readings from Last 1 Encounters:  12/29/16 146 lb (66.2 kg)   Last Height:   Ht Readings from Last 1 Encounters:  12/29/16 5\' 3"  (1.6 m)   Physical exam: Exam: Gen: NAD, conversant, well nourised, obese, well groomed                     CV: RRR, no MRG. No Carotid Bruits. No peripheral edema, warm, nontender Eyes: Conjunctivae  clear without exudates or hemorrhage  Neuro: Detailed Neurologic Exam  Speech:    Speech is normal; fluent and spontaneous with normal comprehension.  Cognition:    The patient is oriented to person, place, and time;     recent and remote memory intact;     language fluent;     normal attention, concentration,     fund of knowledge Cranial Nerves:    The pupils are equal, round, and reactive to light. The fundi are normal and spontaneous venous pulsations are present. Visual fields are full to finger confrontation. Extraocular movements are intact. Trigeminal sensation is intact and the muscles of mastication are normal. The face is symmetric. The palate elevates in the midline. Hearing intact. Voice is normal. Shoulder shrug is normal. The tongue has normal motion without fasciculations.   Coordination:  Normal finger to nose and heel to shin. Normal rapid alternating movements.   Gait:    Heel-toe and tandem gait are normal.   Motor Observation:    No asymmetry, no atrophy, and no involuntary movements noted. Tone:    Normal muscle tone.    Posture:    Posture is normal. normal erect    Strength:    Strength is V/V in the upper and lower limbs.      Sensation: intact to LT     Reflex Exam:  DTR's:    Deep tendon reflexes in the upper and lower extremities are normal bilaterally.   Toes:    The toes are downgoing bilaterally.   Clonus:    Clonus is absent.   Assessment/Plan:  This is a 41 year old patient who has intractable positional headaches since giving birth 4 months ago and after C-section epidural which may have pierced the intrathecal space. She has daily headaches. The headaches are positional, pressure especially occipitally without migrainous features. Patient does have a past medical history of migraines but these headaches are different. MRI of the brain and CTV of the head were unremarkable for causes, did not show any intracranial hypotension however  this sometimes is not seen. Patient's blood pressure had been elevated so we tried a small dose of propranolol to see if this would also help the headaches which it did not. No medicaton overuse.  Stop propranolol Blood patch If no results will order CT myelogram   MRI brain w/wo contrast negative CTV head negative  Kimberly Dean, MD  [facility] [address] [place], [place] [address]  Phone [phone] Fax [phone]  A total of 25 minutes was spent face-to-face with this patient. Over half this time was spent on counseling patient on the  diagnosis and different diagnostic and therapeutic options available.

## 2016-12-29 NOTE — Addendum Note (Signed)
Addended by: Donnelly AngelicaHOGAN, JENNIFER L on: 12/29/2016 09:31 AM   Modules accepted: Orders

## 2016-12-30 ENCOUNTER — Encounter: Payer: Self-pay | Admitting: Neurology

## 2016-12-31 ENCOUNTER — Encounter: Payer: Self-pay | Admitting: Neurology

## 2017-01-04 ENCOUNTER — Other Ambulatory Visit: Payer: BC Managed Care – PPO

## 2017-01-06 ENCOUNTER — Other Ambulatory Visit: Payer: Self-pay | Admitting: Neurology

## 2017-01-06 ENCOUNTER — Ambulatory Visit
Admission: RE | Admit: 2017-01-06 | Discharge: 2017-01-06 | Disposition: A | Discharge status: Home / Self Care | Payer: BC Managed Care – PPO | Source: Ambulatory Visit | Attending: Neurology | Admitting: Neurology

## 2017-01-06 DIAGNOSIS — G9600 Cerebrospinal fluid leak, unspecified: Secondary | ICD-10-CM

## 2017-01-06 MED ORDER — IOPAMIDOL (ISOVUE-M 200) INJECTION 41%
1.0000 mL | Freq: Once | INTRAMUSCULAR | Status: AC
  Administered 2017-01-06: 1 mL via EPIDURAL

## 2017-01-06 NOTE — Discharge Instructions (Signed)

## 2017-01-14 NOTE — Addendum Note (Signed)
Addendum  created 01/14/17 0909 by Kylena Mole, MD   Sign clinical note    

## 2017-01-14 NOTE — Anesthesia Postprocedure Evaluation (Signed)
Anesthesia Post Note  Patient: Kimberly [name]  Procedure(s) Performed: Procedure(s) (LRB): CESAREAN SECTION (N/A)     Anesthesia Post Evaluation  Last Vitals:  Vitals:   08/05/16 0850 08/05/16 1140  BP: 129/80 (!) 168/85  Pulse: 66 69  Resp: 16 16  Temp: 36.3 C 36.3 C    Last Pain:  Vitals:   08/05/16 1150  TempSrc:   PainSc: 1                  Lewie LoronJohn Jaxyn Mestas

## 2017-01-18 ENCOUNTER — Encounter: Payer: Self-pay | Admitting: Neurology

## 2017-01-20 ENCOUNTER — Encounter: Payer: Self-pay | Admitting: Neurology

## 2017-01-20 ENCOUNTER — Ambulatory Visit (INDEPENDENT_AMBULATORY_CARE_PROVIDER_SITE_OTHER): Payer: BC Managed Care – PPO | Admitting: Neurology

## 2017-01-20 VITALS — BP 112/71 | HR 80 | Wt 144.2 lb

## 2017-01-20 DIAGNOSIS — G96810 Intracranial hypotension, unspecified: Secondary | ICD-10-CM

## 2017-01-20 DIAGNOSIS — G9389 Other specified disorders of brain: Secondary | ICD-10-CM

## 2017-01-20 NOTE — Progress Notes (Signed)
[facility]    Provider:  Dr Lucia Gaskins Referring Provider: Abner Greenspan, MD Primary Care Physician:  Abner Greenspan, MD  CC: Headache  The blood patch really helped. Headaches are improved. Will wait another week and then consider another blood patch. She has headaches 1-2x a week, less severe, they last less time. Discussed CT myelogram but do not recommend at this time since she is improved, recommend waiting another week. Husband also provides much information.   Interval history 12/29/2016: Patient returns for chronic daily headaches since epidural during childbirth. MRI of the brain was unremarkable. MRV of the head showed possible venous sinus thrombosis however CTV was normal. She does have a history of migraines in the past that these are different, more occipital like someone has a vice around her head, pressure severe and dizziness, no migrainous features such as light sensitivity smell sensitivity or nausea, worse supine but continuous all day. Patient's blood pressure was elevated that we started her on a small dose of propranolol to see if this would help with her headaches.   [name] a 41 y.o.femalehere as a referral from Dr. Wandra Arthurs headache for 4 months. She has a history of migraines in the past but this is different. She had an emergency c-section and her epidural slipped and was unresponsive that morning. It may have pierced the intrathecal space and she had to have Narcan but she never lost her pulse. Since deliversy she is having elevated blood pressure and occipital headaches. Started 3-4 weeks after delivery.She has daily headaches. Feels like someone has a vice around her head and she is going to explode, the pressure is severe and she has dizziness. No aura, no light sensitivity, no smell sensitivity no nausea. Her head feels like it is going to explode. Ocurs more after lunch but also wakes and is sometimes work supine. Worse  positionally but continuous all day long. Can be a 9/10 on average 6-7. Nothing makes it better. No medication overuse, only take OTC 1-2x. She has blurry vision when she stands and she has to close her eyes. No known triggers. Started acutely and has progressively worsened now it is more frequent, more severe. No focal weakness. Grandmother had strokes.No neck pain.   Reviewed notes, labs and imaging from outside physicians, which showed:  She has a past medical history of migraines but this headache is different and started 3-4 weeks after delivery she is a 39-month-old at home. Headache is occipital, varies in severity, symptoms did not bother her until later in the day sometimes worse with laying down. Her blood pressure has been labile with diastolic goals 101 per last note. She was stopped on birth control pills and referred to neurology. She is status post C-section 07/05/2016. She noticed her heart is racing at home and blood pressure elevated 142/92. However not I'll this time. She is little anxious about having a new baby. 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   Review of Systems: Patient complains of symptoms per HPI as well as the following symptoms: no fever, no rash, no meningismus. Pertinent negatives and positives per HPI. All others negative.  Social History   Social History  . Marital status: Married    Spouse name: N/A  . Number of children: 1  . Years of education: Master's   Occupational History  . Not on file.   Social History Main Topics  . Smoking status: Never Smoker  . Smokeless tobacco: Never Used  .  Alcohol use No     Comment: rarely  . Drug use: No  . Sexual activity: Not on file   Other Topics Concern  . Not on file   Social History Narrative   Lives at home w/ her husband and daughter   Right-handed   Caffeine: 1 drink per day    Family History  Problem Relation Age of Onset  . Heart disease Father     . Diabetes Maternal Grandmother   . Thyroid disease Maternal Grandmother   . Stroke Maternal Grandmother   . Lung cancer Maternal Grandfather   . Heart attack Paternal Grandfather     Past Medical History:  Diagnosis Date  . AMA (advanced maternal age) primigravida 35+   . 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 Headache   . History of endometriosis   . History of kidney stones   . Nasal congestion 07/16/2011  . Sinus infection 07/16/2011   will finish antibiotic 07/18/2011    Past Surgical History:  Procedure Laterality Date  . ablation of endometriosis  2016  . CESAREAN SECTION N/A 08/02/2016   Procedure: CESAREAN SECTION;  Surgeon: Harold HedgeJames Tomblin, MD;  [facility];  Service: Obstetrics;  Laterality: N/A;  . KNEE ARTHROSCOPY  07/22/2011   Procedure: ARTHROSCOPY KNEE;  Surgeon: Loreta Aveaniel F Murphy, MD;  Location: [facility];  Service: Orthopedics;  Laterality: Right;  with lateral release and with debridement/shaving (chondroplasty)  . SKIN GRAFT SPLIT THICKNESS LEG / FOOT  2008   right foot    Current Outpatient Prescriptions  Medication Sig Dispense Refill  . acetaminophen (TYLENOL) 325 MG tablet Take 650 mg by mouth every 6 (six) hours as needed.    Marland Kitchen. ibuprofen (ADVIL,MOTRIN) 600 MG tablet Take 1 tablet (600 mg total) by mouth every 6 (six) hours as needed for moderate pain. 30 tablet 1   No current facility-administered medications for this visit.     Allergies as of 01/20/2017 - Review Complete 01/20/2017  Allergen Reaction Noted  . Diamox [acetazolamide] Other (See Comments) 12/22/2016  . Hydrocodone Nausea And Vomiting 07/16/2011  . Multihance [gadobenate] Hives 12/09/2016  . Scopolamine Other (See Comments) 08/02/2016    Vitals: BP 112/71   Pulse 80   Wt 144 lb 3.2 oz (65.4 kg)   BMI 25.54 kg/m  Last Weight:  Wt Readings from Last 1 Encounters:  01/20/17 144 lb 3.2 oz (65.4 kg)   Last Height:   Ht Readings from Last 1  Encounters:  12/29/16 5\' 3"  (1.6 m)     Neuro: Detailed Neurologic Exam  Speech:    Speech is normal; fluent and spontaneous with normal comprehension.  Cognition:    The patient is oriented to person, place, and time;     recent and remote memory intact;     language fluent;     normal attention, concentration,     fund of knowledge Cranial Nerves:    The pupils are equal, round, and reactive to light. The fundi are normal and spontaneous venous pulsations are present. Visual fields are full to finger confrontation. Extraocular movements are intact. Trigeminal sensation is intact and the muscles of mastication are normal. The face is symmetric. The palate elevates in the midline. Hearing intact. Voice is normal. Shoulder shrug is normal. The tongue has normal motion without fasciculations.   Coordination:    Normal finger to nose and heel to shin. Normal rapid alternating movements.   Gait:    Heel-toe and tandem gait are  normal.   Motor Observation:    No asymmetry, no atrophy, and no involuntary movements noted. Tone:    Normal muscle tone.    Posture:    Posture is normal. normal erect    Strength:    Strength is V/V in the upper and lower limbs.      Sensation: intact to LT     Reflex Exam:  DTR's:    Deep tendon reflexes in the upper and lower extremities are normal bilaterally.   Toes:    The toes are downgoing bilaterally.   Clonus:    Clonus is absent.   Assessment/Plan:This is a 41 year old patient who has intractable positional headaches since giving birth 4 months ago and after C-section epidural which may have pierced the intrathecal space. She has daily headaches. The headaches are positional, pressure especially occipitally without migrainous features. Patient does have a past medical history of migraines but these headaches are different. MRI of the brain and CTV of the head were unremarkable for causes, did not show any intracranial  hypotension however this sometimes is not seen. Patient's blood pressure had been elevated so we tried a small dose of propranolol to see if this would also help the headaches which it did not. No medicaton overuse.  Stop propranolol Blood patch with improved results. Wait another week and consider repeat blood patch if needed CT myelogram as last resort  MRI brain w/wo contrast negative CTV head negative  [facility] [address] [place], [place] [address]  Phone [phone] Fax [phone]  A total of 15 minutes was spent face-to-face with this patient. Over half this time was spent on counseling patient on the intracranial hypotension diagnosis and different diagnostic and therapeutic options available.

## 2019-05-27 IMAGING — CT CT ANGIO HEAD
3 of 5 series · 18 of 30 positions shown · IV contrast (isovue)
Comparison: None.

CLINICAL DATA: Severe headaches since childbirth 08/02/2016.
Followup cerebral thrombosis. Thrombosis versus hypoplasia.

EXAM:
CT ANGIOGRAPHY HEAD
TECHNIQUE: Multidetector CT imaging of the head was performed using the
standard protocol during bolus administration of intravenous
contrast. Multiplanar CT image reconstructions and MIPs were
obtained to evaluate the vascular anatomy.
CONTRAST:  75 cc Isovue 370

[Series 5: thins for reformatting · axial · 0.49mm/px · z∈[+10,+143]mm · 12 of 252 slices shown]
[im 20/252  brain]
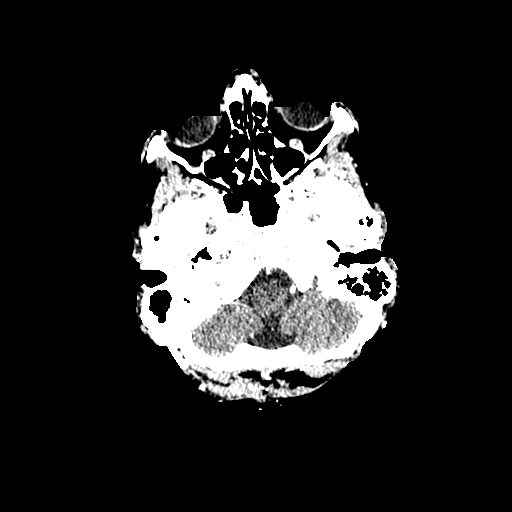
[im 39/252  bone]
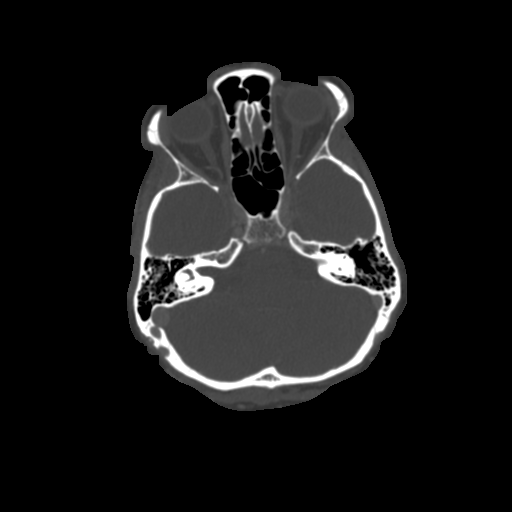
[im 58/252  brain]
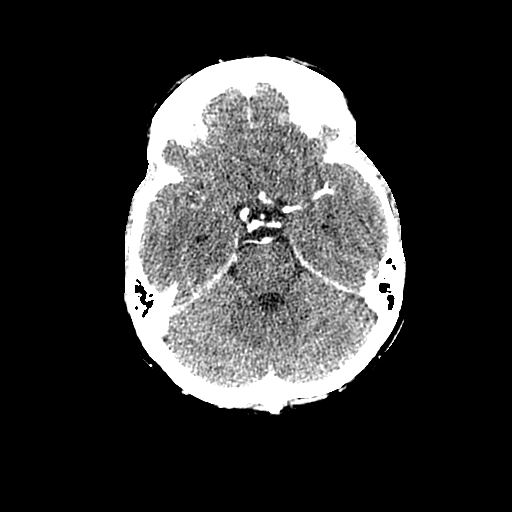
[im 78/252  bone]
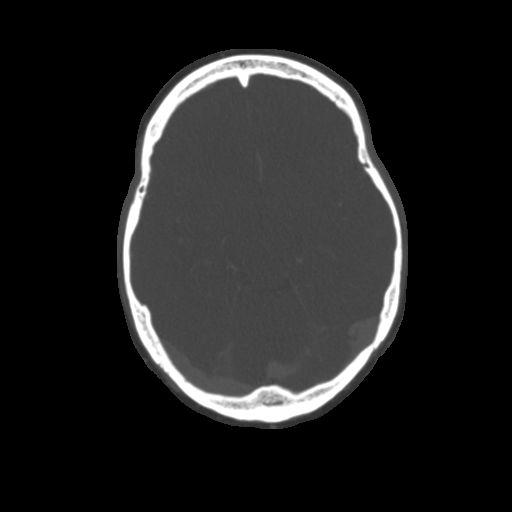
[im 97/252  brain]
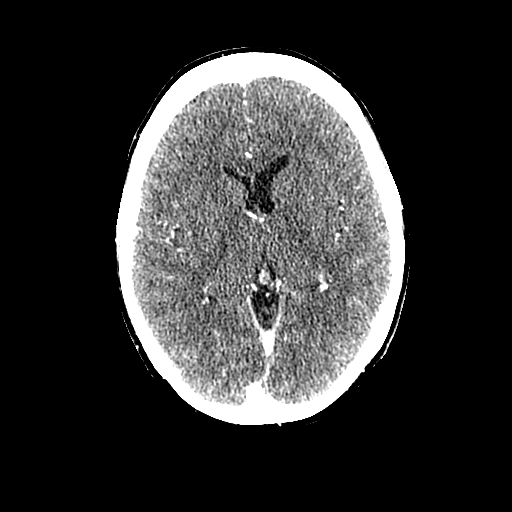
[im 116/252  bone]
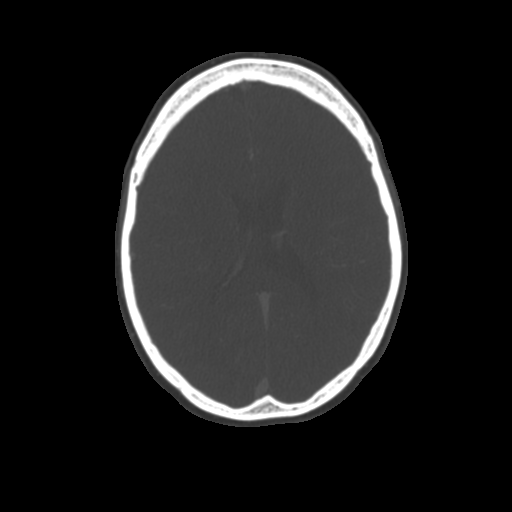
[im 136/252  brain]
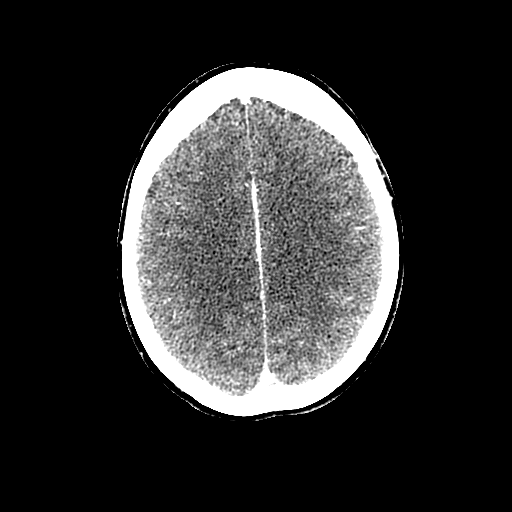
[im 155/252  bone]
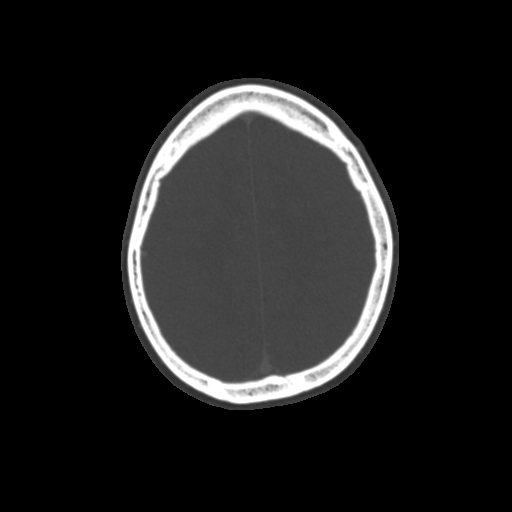
[im 174/252  brain]
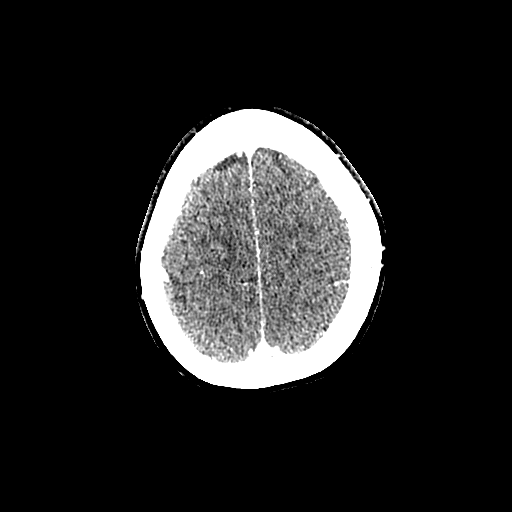
[im 194/252  bone]
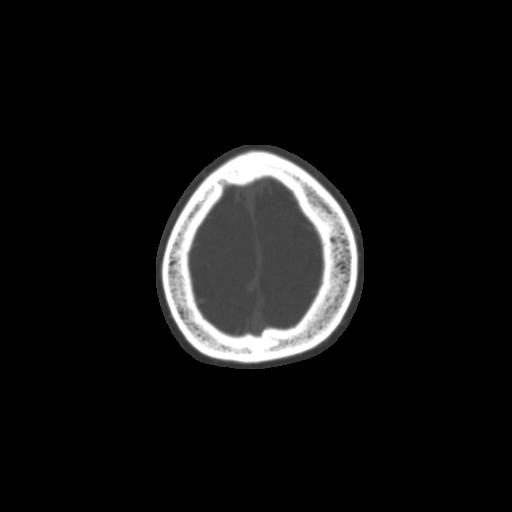
[im 213/252  brain]
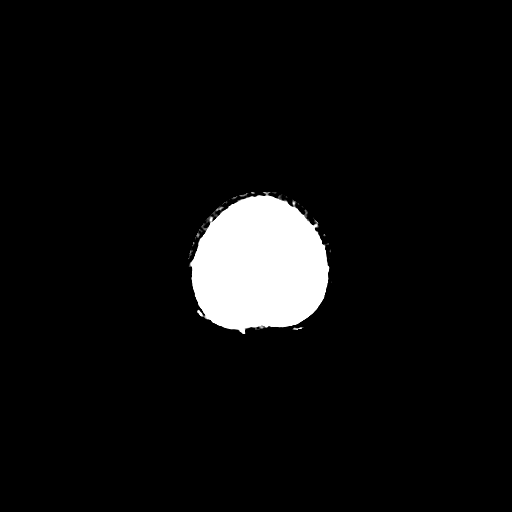
[im 232/252  bone]
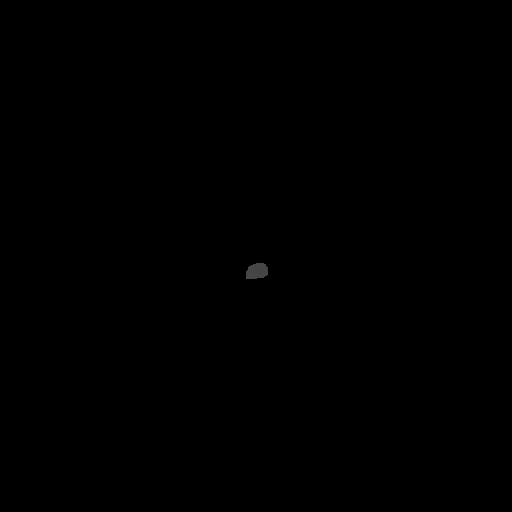

[Series 601: cor brain venogram · coronal · 0.49mm/px · 3 of 97 slices shown]
[im 25/97  brain]
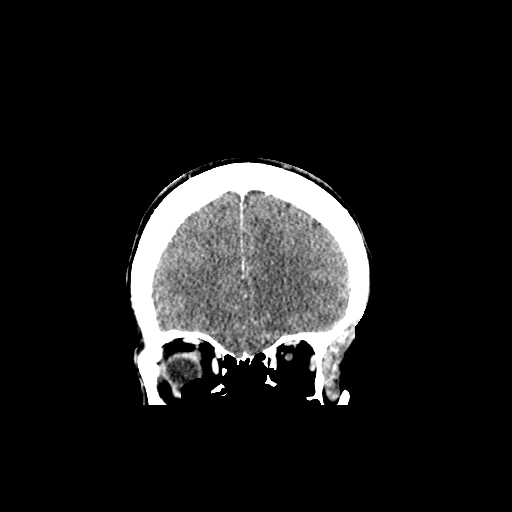
[im 49/97  brain]
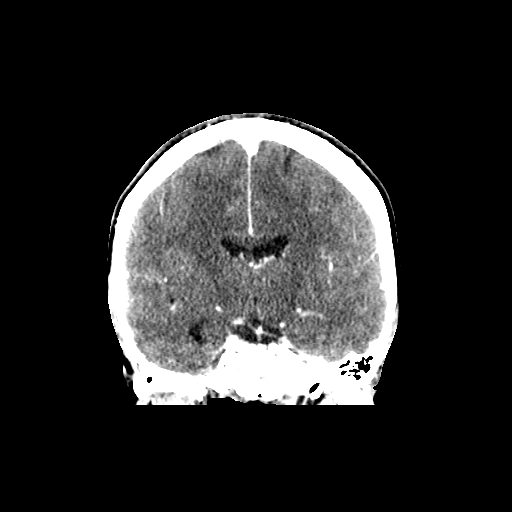
[im 73/97  brain]
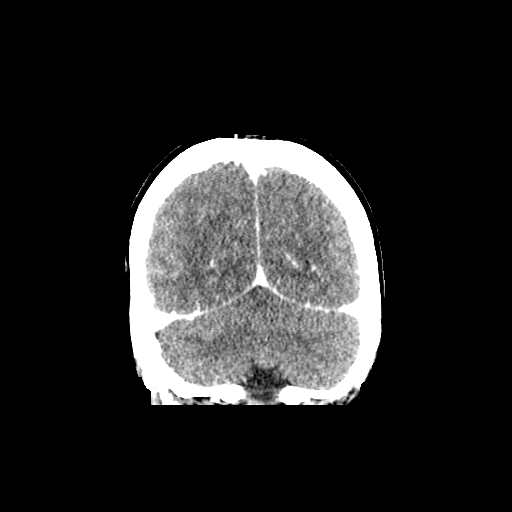

[Series 602: sag brain venogram · sagittal · 0.49mm/px · 3 of 88 slices shown]
[im 22/88  brain]
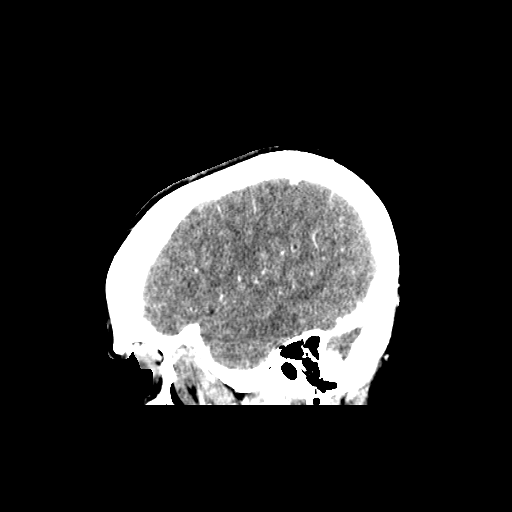
[im 44/88  brain]
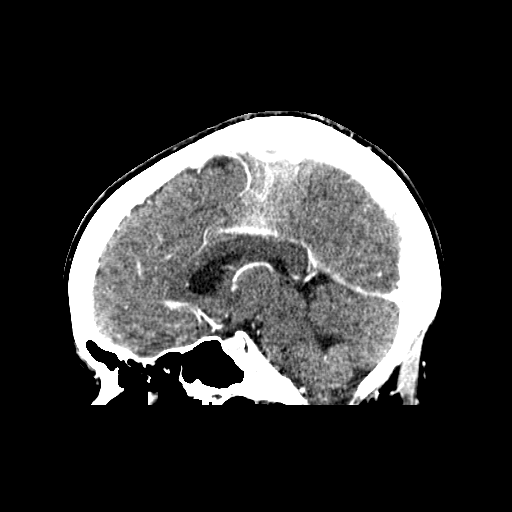
[im 66/88  brain]
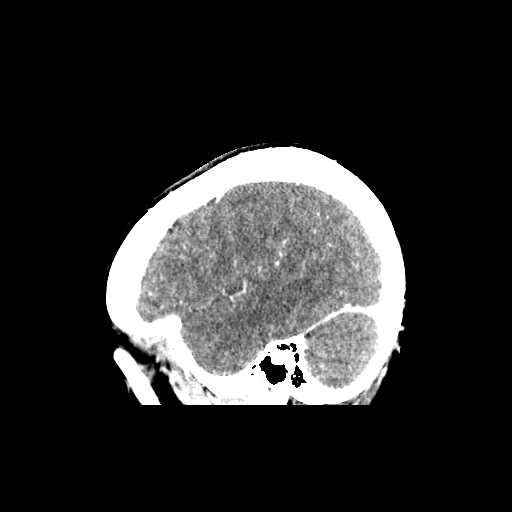

[18 of 30 positions shown; findings below may reference images not displayed]

FINDINGS: CT HEAD

Brain: No evidence of malformation, atrophy, old or acute small or
large vessel infarction, mass lesion, hemorrhage, hydrocephalus or
extra-axial collection. No evidence of pituitary lesion.

Vascular: No vascular calcification.  No hyperdense vessels.

Skull: Normal.  No fracture or focal bone lesion.

Sinuses/Orbits: Visualized sinuses are clear. No fluid in the middle
ears or mastoids. Visualized orbits are normal.

Other: None significant

CTA HEAD

Or examination was tailored to evaluation of the venous structures.
Nonetheless, arterial structures as seen appear normal without
evidence of aneurysm or vascular malformation.

Superior sagittal sinus is widely patent and normal. Both transverse
sinuses are patent and normal. No sign of acute or chronic
thrombosis. Sigmoid sinuses are normal. Jugular veins are patent.
Patient has a pain of low bad 80 on the left. Deep venous system is
normal.
IMPRESSION: Normal head CT.

Normal intracranial CT venogram. Specifically, left transverse
sinus, though smaller than the right, is intrinsically normal. No
evidence of old or recent thrombosis.

## 2019-06-18 IMAGING — XA Imaging study
1 series · 1 of 1 positions shown · IV contrast (isovue)
Comparison: None.

CLINICAL DATA: Childbirth 4 months ago. Positional headache
syndrome began 1 month following childbirth. Some what complicated
epidural by history. Epidural at the L3-4 level.

EXAM:
LUMBAR EPIDUROGRAM AND BLOOD PATCH
FLUOROSCOPY TIME:  0 minutes 8 seconds. 5.0 micro gray meter squared
TECHNIQUE: Blood was drawn from the patient's right antecubital fossa by
myself. The skin entry site from previous lumbar puncture was
localized corresponding to the space between the L3 and L4 spinous
processes. An appropriate skin entry site was determined. Operator
donned sterile gloves and mask. Skin site was marked, prepped with
Betadine, and draped in usual sterile fashion, and infiltrated
locally with 1% lidocaine. The 20 gauge spinal needle was advanced
into the lumbar posterior epidural space near the midline using a
left interlaminar approach at L3-67using loss of resistance
technique. Diagnostic injection of 2ml Isovue-M 200 demonstrates
good epidural spread above and below the needle tip and crossing the
midline. No intravascular or subarachnoid component. 18 ml of
autologous blood were then administered as lumbar epidural blood
patch. No immediate complication.

[Series 1: ortho standard · 1 of 1 slices shown]
[im 1/1]
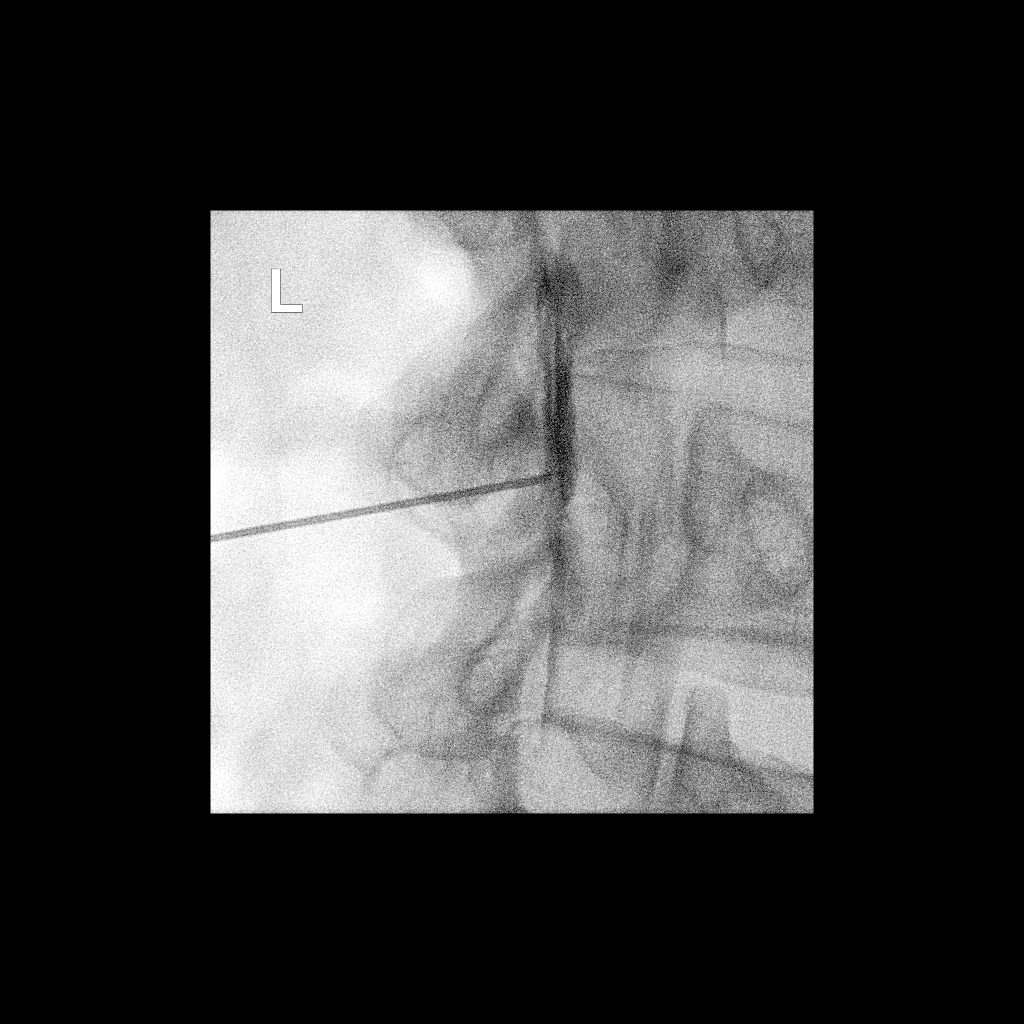

[1 of 1 positions shown; findings below may reference images not displayed]

IMPRESSION: 1. Technically successful lumbar epidural blood patch under
fluoroscopy. Patient instructed to lie flat till tomorrow morning.

## 2019-07-16 LAB — OB RESULTS CONSOLE HEPATITIS B SURFACE ANTIGEN: Hepatitis B Surface Ag: NEGATIVE

## 2019-07-16 LAB — OB RESULTS CONSOLE RUBELLA ANTIBODY, IGM: Rubella: IMMUNE

## 2019-07-16 LAB — OB RESULTS CONSOLE RPR: RPR: NONREACTIVE

## 2019-07-16 LAB — OB RESULTS CONSOLE GC/CHLAMYDIA
Chlamydia: NEGATIVE
Gonorrhea: NEGATIVE

## 2019-07-16 LAB — OB RESULTS CONSOLE HIV ANTIBODY (ROUTINE TESTING): HIV: NONREACTIVE

## 2019-07-16 LAB — OB RESULTS CONSOLE ABO/RH: RH Type: POSITIVE

## 2019-07-16 LAB — OB RESULTS CONSOLE ANTIBODY SCREEN: Antibody Screen: NEGATIVE

## 2019-12-04 ENCOUNTER — Other Ambulatory Visit: Payer: Self-pay

## 2019-12-04 ENCOUNTER — Encounter: Payer: BC Managed Care – PPO | Attending: Obstetrics and Gynecology | Admitting: Dietician

## 2019-12-04 ENCOUNTER — Encounter: Payer: Self-pay | Admitting: Dietician

## 2019-12-04 DIAGNOSIS — R7309 Other abnormal glucose: Secondary | ICD-10-CM | POA: Diagnosis not present

## 2019-12-04 NOTE — Progress Notes (Signed)
Patient was seen on 12/04/2019 for Gestational Diabetes self-management education at the [facility] office. The following learning objectives were met by the patient during this course:   States the definition of Gestational Diabetes  States why dietary management is important in controlling blood glucose  Describes the effects each nutrient has on blood glucose levels  Demonstrates ability to create a balanced meal plan  Demonstrates carbohydrate counting   States when to check blood glucose levels  Demonstrates proper blood glucose monitoring techniques  States the effect of stress and exercise on blood glucose levels  States the importance of limiting caffeine and abstaining from alcohol and smoking  Blood glucose monitor given: [employer] Lot # [id number] Exp: 9/31/2021  Patient instructed to monitor glucose levels: FBS: 60 - <95; 1 hour: <140; 2 hour: <120  Patient received handouts:  Nutrition Diabetes and Pregnancy  Blood Glucose Log  Patient will be seen for follow-up as needed.

## 2020-02-04 ENCOUNTER — Encounter (HOSPITAL_COMMUNITY): Payer: Self-pay

## 2020-02-04 NOTE — Patient Instructions (Signed)
Guneet Delpino  02/04/2020   Your procedure is scheduled on:  02/13/2020  Arrive at 0900 at Entrance C on [address] at [facility]  and [employer]. You are invited to use the FREE valet parking or use the Visitor's parking deck.  Pick up the phone at the desk and dial [phone].  Call this number if you have problems the morning of surgery: [phone]  Remember:   Do not eat food:(After Midnight) Desps de medianoche.  Do not drink clear liquids: (After Midnight) Desps de medianoche.  Take these medicines the morning of surgery with A SIP OF WATER:  none   Do not wear jewelry, make-up or nail polish.  Do not wear lotions, powders, or perfumes. Do not wear deodorant.  Do not shave 48 hours prior to surgery.  Do not bring valuables to the hospital.  [facility] is not   responsible for any belongings or valuables brought to the hospital.  Contacts, dentures or bridgework may not be worn into surgery.  Leave suitcase in the car. After surgery it may be brought to your room.  For patients admitted to the hospital, checkout time is 11:00 AM the day of              discharge.      Please read over the following fact sheets that you were given:     Preparing for Surgery

## 2020-02-11 ENCOUNTER — Other Ambulatory Visit: Payer: Self-pay

## 2020-02-11 ENCOUNTER — Other Ambulatory Visit (HOSPITAL_COMMUNITY)
Admission: RE | Admit: 2020-02-11 | Discharge: 2020-02-11 | Disposition: A | Discharge status: Home / Self Care | Payer: BC Managed Care – PPO | Source: Ambulatory Visit | Attending: Obstetrics and Gynecology | Admitting: Obstetrics and Gynecology

## 2020-02-11 DIAGNOSIS — Z01812 Encounter for preprocedural laboratory examination: Secondary | ICD-10-CM | POA: Insufficient documentation

## 2020-02-11 DIAGNOSIS — Z20822 Contact with and (suspected) exposure to covid-19: Secondary | ICD-10-CM | POA: Insufficient documentation

## 2020-02-11 DIAGNOSIS — O34211 Maternal care for low transverse scar from previous cesarean delivery: Secondary | ICD-10-CM | POA: Diagnosis not present

## 2020-02-11 HISTORY — DX: Gestational diabetes mellitus in pregnancy, unspecified control: O24.419

## 2020-02-11 HISTORY — DX: Other complications of anesthesia, initial encounter: T88.59XA

## 2020-02-11 LAB — CBC
HCT: 37.4 % (ref 36.0–46.0)
Hemoglobin: 12.5 g/dL (ref 12.0–15.0)
MCH: 33.2 pg (ref 26.0–34.0)
MCHC: 33.4 g/dL (ref 30.0–36.0)
MCV: 99.5 fL (ref 80.0–100.0)
Platelets: 144 10*3/uL — ABNORMAL LOW (ref 150–400)
RBC: 3.76 MIL/uL — ABNORMAL LOW (ref 3.87–5.11)
RDW: 13 % (ref 11.5–15.5)
WBC: 6.8 10*3/uL (ref 4.0–10.5)
nRBC: 0 % (ref 0.0–0.2)

## 2020-02-11 LAB — TYPE AND SCREEN
ABO/RH(D): A POS
Antibody Screen: NEGATIVE

## 2020-02-11 LAB — RPR: RPR Ser Ql: NONREACTIVE

## 2020-02-11 LAB — SARS CORONAVIRUS 2 (TAT 6-24 HRS): SARS Coronavirus 2: NEGATIVE

## 2020-02-11 NOTE — Progress Notes (Signed)
Pt presents for Covid-19 test and pre op labs.  Tolerated testing.  Given pre op instructions and body cleanser.

## 2020-02-12 ENCOUNTER — Encounter (HOSPITAL_COMMUNITY): Payer: Self-pay | Admitting: Obstetrics and Gynecology

## 2020-02-12 NOTE — H&P (Signed)
Kimberly [name] is a 44 y.o. female presenting for repeat cesarean section and bilateral tubal ligation. Pregnancy complicated by AMA>Panorama C/W Trisomy XXX. MFM consult and amnio confirms. Fetal cardiac echo normal. Also A1 GDM, H/O PP anxiety-no meds currently and Hx of C/S. OB History    Gravida  2   Para  1   Term  1   Preterm      AB      Living  1     SAB      TAB      Ectopic      Multiple  0   Live Births  1          Past Medical History:  Diagnosis Date  . AMA (advanced maternal age) primigravida 35+   . 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 Complication of anesthesia    pre pt had problems with anesthesia in her CS  . Gestational diabetes   . Headache   . History of endometriosis   . History of kidney stones   . Nasal congestion 07/16/2011  . Sinus infection 07/16/2011   will finish antibiotic 07/18/2011   Past Surgical History:  Procedure Laterality Date  . ablation of endometriosis  2016  . CESAREAN SECTION N/A 08/02/2016   Procedure: CESAREAN SECTION;  Surgeon: Harold Hedge, MD;  Location: [facility];  Service: Obstetrics;  Laterality: N/A;  . KNEE ARTHROSCOPY  07/22/2011   Procedure: ARTHROSCOPY KNEE;  Surgeon: Loreta Ave, MD;  Location: [facility];  Service: Orthopedics;  Laterality: Right;  with lateral release and with debridement/shaving (chondroplasty)  . SKIN GRAFT SPLIT THICKNESS LEG / FOOT  2008   right foot   Family History: family history includes Diabetes in her maternal grandmother; Heart attack in her paternal grandfather; Heart disease in her father; Lung cancer in her maternal grandfather; Stroke in her maternal grandmother; Thyroid disease in her maternal grandmother. Social History:  reports that she has never smoked. She has never used smokeless tobacco. She reports that she does not drink alcohol and does not use drugs.     Maternal Diabetes: Yes:  Diabetes Type:  Diet controlled Genetic  Screening: Abnormal:  Results: Other: Trisomy XXX Maternal Ultrasounds/Referrals: Normal Fetal Ultrasounds or other Referrals:  Fetal echo, Referred to Materal Fetal Medicine  Maternal Substance Abuse:  No Significant Maternal Medications:  None Significant Maternal Lab Results:  Group B Strep negative Other Comments:  None  Review of Systems  Eyes: Negative for visual disturbance.  Gastrointestinal: Negative for abdominal pain.  Neurological: Negative for headaches.   Maternal Medical History:  Fetal activity: Perceived fetal activity is normal.        Last menstrual period 05/15/2019, unknown if currently breastfeeding. Exam Physical Exam Cardiovascular:     Rate and Rhythm: Normal rate.  Pulmonary:     Effort: Pulmonary effort is normal.     Prenatal labs: ABO, Rh: --/--/A POS (08/09 0849) Antibody: NEG (08/09 0849) Rubella: Immune (01/11 0000) RPR: NON REACTIVE (08/09 0849)  HBsAg: Negative (01/11 0000)  HIV: Non-reactive (01/11 0000)  GBS:   negative 02/08/20  Assessment/Plan: 44 yo with previous C/S for repeat C/S with BTL Trisomy XXX infant-fetal cardiac echo normal D/W procedure and risks including infection, organ damage, bleeding/transfusion-HIV/Hep, DVT/PE, pneumonia. Permanence of BTL, failure rate and increased ectopic risk reviewed.   Roselle Locus II 02/12/2020, 6:29 PM

## 2020-02-12 NOTE — Anesthesia Preprocedure Evaluation (Addendum)
Anesthesia Evaluation  Patient identified by MRN, date of birth, ID band Patient awake    Reviewed: Allergy & Precautions, NPO status , Patient's Chart, lab work & pertinent test results  History of Anesthesia Complications (+) history of anesthetic complications  Airway Mallampati: II  TM Distance: >3 FB Neck ROM: Full    Dental no notable dental hx. (+) Teeth Intact   Pulmonary neg pulmonary ROS,    Pulmonary exam normal breath sounds clear to auscultation       Cardiovascular negative cardio ROS Normal cardiovascular exam Rhythm:Regular Rate:Normal     Neuro/Psych  Headaches, negative psych ROS   GI/Hepatic negative GI ROS, Neg liver ROS,   Endo/Other  diabetes  Renal/GU Hx/o renal calculi  negative genitourinary   Musculoskeletal  (+) Arthritis , Osteoarthritis,    Abdominal   Peds  Hematology  (+) anemia , Thrombocytopenia-mild   Anesthesia Other Findings   Reproductive/Obstetrics (+) Pregnancy Previous C/Section x 1 Undesired fertility Hx/o endometriosis                            Anesthesia Physical Anesthesia Plan  ASA: II  Anesthesia Plan: Spinal   Post-op Pain Management:    Induction:   PONV Risk Score and Plan: 4 or greater and Scopolamine patch - Pre-op and Treatment may vary due to age or medical condition  Airway Management Planned: Natural Airway  Additional Equipment:   Intra-op Plan:   Post-operative Plan:   Informed Consent: I have reviewed the patients History and Physical, chart, labs and discussed the procedure including the risks, benefits and alternatives for the proposed anesthesia with the patient or authorized representative who has indicated his/her understanding and acceptance.     Dental advisory given  Plan Discussed with: Anesthesiologist and CRNA  Anesthesia Plan Comments:        Anesthesia Quick Evaluation

## 2020-02-13 ENCOUNTER — Other Ambulatory Visit: Payer: Self-pay

## 2020-02-13 ENCOUNTER — Inpatient Hospital Stay (HOSPITAL_COMMUNITY): Payer: BC Managed Care – PPO | Admitting: Anesthesiology

## 2020-02-13 ENCOUNTER — Inpatient Hospital Stay (HOSPITAL_COMMUNITY)
Admission: RE | Admit: 2020-02-13 | Discharge: 2020-02-15 | DRG: 785 | Disposition: A | Discharge status: Home / Self Care | Payer: BC Managed Care – PPO | Attending: Obstetrics and Gynecology | Admitting: Obstetrics and Gynecology

## 2020-02-13 ENCOUNTER — Encounter (HOSPITAL_COMMUNITY)
Admission: RE | Disposition: A | Discharge status: Home / Self Care | Payer: Self-pay | Source: Home / Self Care | Attending: Obstetrics and Gynecology

## 2020-02-13 ENCOUNTER — Encounter (HOSPITAL_COMMUNITY): Payer: Self-pay | Admitting: Obstetrics and Gynecology

## 2020-02-13 DIAGNOSIS — O24420 Gestational diabetes mellitus in childbirth, diet controlled: Secondary | ICD-10-CM | POA: Diagnosis present

## 2020-02-13 DIAGNOSIS — Z3A39 39 weeks gestation of pregnancy: Secondary | ICD-10-CM

## 2020-02-13 DIAGNOSIS — O351XX0 Maternal care for (suspected) chromosomal abnormality in fetus, not applicable or unspecified: Secondary | ICD-10-CM | POA: Diagnosis present

## 2020-02-13 DIAGNOSIS — O34211 Maternal care for low transverse scar from previous cesarean delivery: Secondary | ICD-10-CM | POA: Diagnosis present

## 2020-02-13 DIAGNOSIS — Z98891 History of uterine scar from previous surgery: Secondary | ICD-10-CM

## 2020-02-13 DIAGNOSIS — Z302 Encounter for sterilization: Secondary | ICD-10-CM

## 2020-02-13 DIAGNOSIS — Z20822 Contact with and (suspected) exposure to covid-19: Secondary | ICD-10-CM | POA: Diagnosis present

## 2020-02-13 LAB — GLUCOSE, CAPILLARY
Glucose-Capillary: 70 mg/dL (ref 70–99)
Glucose-Capillary: 76 mg/dL (ref 70–99)

## 2020-02-13 SURGERY — Surgical Case
Anesthesia: Spinal | Laterality: Bilateral

## 2020-02-13 MED ORDER — DEXTROSE 5 % IV SOLN: 3.0000 g | INTRAVENOUS | Status: DC

## 2020-02-13 MED ORDER — CEFAZOLIN SODIUM-DEXTROSE 2-3 GM-%(50ML) IV SOLR
INTRAVENOUS | Status: DC | PRN
  Administered 2020-02-13: 2 g via INTRAVENOUS

## 2020-02-13 MED ORDER — NALBUPHINE HCL 10 MG/ML IJ SOLN
2.5000 mg | INTRAMUSCULAR | Status: DC | PRN
  Filled 2020-02-13: qty 1

## 2020-02-13 MED ORDER — SIMETHICONE 80 MG PO CHEW
80.0000 mg | CHEWABLE_TABLET | Freq: Three times a day (TID) | ORAL | Status: DC
  Administered 2020-02-13 – 2020-02-15 (×4): 80 mg via ORAL
  Filled 2020-02-13 (×4): qty 1

## 2020-02-13 MED ORDER — KETOROLAC TROMETHAMINE 30 MG/ML IJ SOLN
30.0000 mg | Freq: Four times a day (QID) | INTRAMUSCULAR | Status: AC
  Administered 2020-02-13 – 2020-02-14 (×4): 30 mg via INTRAVENOUS
  Filled 2020-02-13 (×4): qty 1

## 2020-02-13 MED ORDER — OXYTOCIN-SODIUM CHLORIDE 30-0.9 UT/500ML-% IV SOLN: 2.5000 [IU]/h | INTRAVENOUS | Status: AC

## 2020-02-13 MED ORDER — LACTATED RINGERS IV SOLN: INTRAVENOUS | Status: DC

## 2020-02-13 MED ORDER — CEFAZOLIN SODIUM-DEXTROSE 2-4 GM/100ML-% IV SOLN
INTRAVENOUS | Status: AC
  Filled 2020-02-13: qty 100

## 2020-02-13 MED ORDER — SOD CITRATE-CITRIC ACID 500-334 MG/5ML PO SOLN
30.0000 mL | ORAL | Status: AC
  Administered 2020-02-13: 30 mL via ORAL

## 2020-02-13 MED ORDER — ONDANSETRON HCL 4 MG/2ML IJ SOLN
INTRAMUSCULAR | Status: AC
  Filled 2020-02-13: qty 2

## 2020-02-13 MED ORDER — ONDANSETRON HCL 4 MG/2ML IJ SOLN
INTRAMUSCULAR | Status: DC | PRN
  Administered 2020-02-13 (×2): 4 mg via INTRAVENOUS

## 2020-02-13 MED ORDER — PHENYLEPHRINE HCL-NACL 20-0.9 MG/250ML-% IV SOLN
INTRAVENOUS | Status: AC
  Filled 2020-02-13: qty 250

## 2020-02-13 MED ORDER — POVIDONE-IODINE 10 % EX SWAB
2.0000 "application " | Freq: Once | CUTANEOUS | Status: AC
  Administered 2020-02-13: 2 via TOPICAL

## 2020-02-13 MED ORDER — BUPIVACAINE IN DEXTROSE 0.75-8.25 % IT SOLN
INTRATHECAL | Status: DC | PRN
  Administered 2020-02-13: 1.5 mL via INTRATHECAL

## 2020-02-13 MED ORDER — OXYCODONE HCL 5 MG PO TABS: 5.0000 mg | ORAL_TABLET | ORAL | Status: DC | PRN

## 2020-02-13 MED ORDER — DIPHENHYDRAMINE HCL 50 MG/ML IJ SOLN
INTRAMUSCULAR | Status: AC
  Filled 2020-02-13: qty 1

## 2020-02-13 MED ORDER — FENTANYL CITRATE (PF) 100 MCG/2ML IJ SOLN
INTRAMUSCULAR | Status: DC | PRN
  Administered 2020-02-13: 15 ug via INTRATHECAL

## 2020-02-13 MED ORDER — KETOROLAC TROMETHAMINE 30 MG/ML IJ SOLN
INTRAMUSCULAR | Status: AC
  Filled 2020-02-13: qty 1

## 2020-02-13 MED ORDER — DIPHENHYDRAMINE HCL 25 MG PO CAPS: 25.0000 mg | ORAL_CAPSULE | ORAL | Status: DC | PRN

## 2020-02-13 MED ORDER — KETOROLAC TROMETHAMINE 30 MG/ML IJ SOLN: 30.0000 mg | Freq: Four times a day (QID) | INTRAMUSCULAR | Status: DC | PRN

## 2020-02-13 MED ORDER — LACTATED RINGERS IV SOLN: INTRAVENOUS | Status: DC | PRN

## 2020-02-13 MED ORDER — WITCH HAZEL-GLYCERIN EX PADS: 1.0000 "application " | MEDICATED_PAD | CUTANEOUS | Status: DC | PRN

## 2020-02-13 MED ORDER — LACTATED RINGERS IV BOLUS
500.0000 mL | Freq: Once | INTRAVENOUS | Status: AC
  Administered 2020-02-13: 500 mL via INTRAVENOUS

## 2020-02-13 MED ORDER — OXYTOCIN-SODIUM CHLORIDE 30-0.9 UT/500ML-% IV SOLN
INTRAVENOUS | Status: DC | PRN
  Administered 2020-02-13: 250 mL via INTRAVENOUS
  Administered 2020-02-13: 250 mL/h via INTRAVENOUS

## 2020-02-13 MED ORDER — DIBUCAINE (PERIANAL) 1 % EX OINT: 1.0000 "application " | TOPICAL_OINTMENT | CUTANEOUS | Status: DC | PRN

## 2020-02-13 MED ORDER — ACETAMINOPHEN 325 MG PO TABS
650.0000 mg | ORAL_TABLET | ORAL | Status: DC | PRN
  Administered 2020-02-13 – 2020-02-15 (×4): 650 mg via ORAL
  Filled 2020-02-13 (×4): qty 2

## 2020-02-13 MED ORDER — SODIUM CHLORIDE 0.9% FLUSH: 3.0000 mL | INTRAVENOUS | Status: DC | PRN

## 2020-02-13 MED ORDER — KETOROLAC TROMETHAMINE 30 MG/ML IJ SOLN
30.0000 mg | Freq: Four times a day (QID) | INTRAMUSCULAR | Status: DC | PRN
  Administered 2020-02-13: 30 mg via INTRAVENOUS

## 2020-02-13 MED ORDER — HYDROMORPHONE HCL 1 MG/ML IJ SOLN: 0.2000 mg | INTRAMUSCULAR | Status: DC | PRN

## 2020-02-13 MED ORDER — MEPERIDINE HCL 25 MG/ML IJ SOLN: 6.2500 mg | INTRAMUSCULAR | Status: DC | PRN

## 2020-02-13 MED ORDER — ONDANSETRON HCL 4 MG/2ML IJ SOLN
4.0000 mg | Freq: Three times a day (TID) | INTRAMUSCULAR | Status: DC | PRN
  Administered 2020-02-14: 4 mg via INTRAVENOUS
  Filled 2020-02-13: qty 2

## 2020-02-13 MED ORDER — SIMETHICONE 80 MG PO CHEW
80.0000 mg | CHEWABLE_TABLET | ORAL | Status: DC
  Administered 2020-02-13 – 2020-02-14 (×2): 80 mg via ORAL
  Filled 2020-02-13 (×2): qty 1

## 2020-02-13 MED ORDER — MORPHINE SULFATE (PF) 0.5 MG/ML IJ SOLN
INTRAMUSCULAR | Status: DC | PRN
  Administered 2020-02-13: .15 mg via INTRATHECAL

## 2020-02-13 MED ORDER — DEXAMETHASONE SODIUM PHOSPHATE 10 MG/ML IJ SOLN
INTRAMUSCULAR | Status: AC
  Filled 2020-02-13: qty 1

## 2020-02-13 MED ORDER — DIPHENHYDRAMINE HCL 25 MG PO CAPS: 25.0000 mg | ORAL_CAPSULE | Freq: Four times a day (QID) | ORAL | Status: DC | PRN

## 2020-02-13 MED ORDER — NALBUPHINE HCL 10 MG/ML IJ SOLN: 2.5000 mg | INTRAMUSCULAR | Status: DC | PRN

## 2020-02-13 MED ORDER — TETANUS-DIPHTH-ACELL PERTUSSIS 5-2.5-18.5 LF-MCG/0.5 IM SUSP: 0.5000 mL | Freq: Once | INTRAMUSCULAR | Status: DC

## 2020-02-13 MED ORDER — PRENATAL MULTIVITAMIN CH
1.0000 | ORAL_TABLET | Freq: Every day | ORAL | Status: DC
  Administered 2020-02-13 – 2020-02-15 (×3): 1 via ORAL
  Filled 2020-02-13 (×3): qty 1

## 2020-02-13 MED ORDER — FENTANYL CITRATE (PF) 100 MCG/2ML IJ SOLN
INTRAMUSCULAR | Status: AC
  Filled 2020-02-13: qty 2

## 2020-02-13 MED ORDER — SOD CITRATE-CITRIC ACID 500-334 MG/5ML PO SOLN
ORAL | Status: AC
  Filled 2020-02-13: qty 30

## 2020-02-13 MED ORDER — PHENYLEPHRINE HCL-NACL 20-0.9 MG/250ML-% IV SOLN
INTRAVENOUS | Status: DC | PRN
  Administered 2020-02-13: 60 ug/min via INTRAVENOUS

## 2020-02-13 MED ORDER — ZOLPIDEM TARTRATE 5 MG PO TABS: 5.0000 mg | ORAL_TABLET | Freq: Every evening | ORAL | Status: DC | PRN

## 2020-02-13 MED ORDER — NALOXONE HCL 4 MG/10ML IJ SOLN
1.0000 ug/kg/h | INTRAVENOUS | Status: DC | PRN
  Filled 2020-02-13: qty 5

## 2020-02-13 MED ORDER — NALBUPHINE HCL 10 MG/ML IJ SOLN: 2.5000 mg | Freq: Once | INTRAMUSCULAR | Status: DC | PRN

## 2020-02-13 MED ORDER — STERILE WATER FOR IRRIGATION IR SOLN
Status: DC | PRN
  Administered 2020-02-13: 1000 mL

## 2020-02-13 MED ORDER — SIMETHICONE 80 MG PO CHEW: 80.0000 mg | CHEWABLE_TABLET | ORAL | Status: DC | PRN

## 2020-02-13 MED ORDER — FENTANYL CITRATE (PF) 100 MCG/2ML IJ SOLN: 25.0000 ug | INTRAMUSCULAR | Status: DC | PRN

## 2020-02-13 MED ORDER — MENTHOL 3 MG MT LOZG: 1.0000 | LOZENGE | OROMUCOSAL | Status: DC | PRN

## 2020-02-13 MED ORDER — SENNOSIDES-DOCUSATE SODIUM 8.6-50 MG PO TABS
2.0000 | ORAL_TABLET | ORAL | Status: DC
  Administered 2020-02-13 – 2020-02-14 (×2): 2 via ORAL
  Filled 2020-02-13 (×2): qty 2

## 2020-02-13 MED ORDER — DEXAMETHASONE SODIUM PHOSPHATE 10 MG/ML IJ SOLN
INTRAMUSCULAR | Status: DC | PRN
  Administered 2020-02-13: 10 mg via INTRAVENOUS

## 2020-02-13 MED ORDER — SODIUM CHLORIDE 0.9 % IR SOLN
Status: DC | PRN
  Administered 2020-02-13: 1

## 2020-02-13 MED ORDER — NALOXONE HCL 0.4 MG/ML IJ SOLN: 0.4000 mg | INTRAMUSCULAR | Status: DC | PRN

## 2020-02-13 MED ORDER — IBUPROFEN 600 MG PO TABS
600.0000 mg | ORAL_TABLET | Freq: Four times a day (QID) | ORAL | Status: DC | PRN
  Administered 2020-02-14 – 2020-02-15 (×2): 600 mg via ORAL
  Filled 2020-02-13 (×2): qty 1

## 2020-02-13 MED ORDER — COCONUT OIL OIL: 1.0000 "application " | TOPICAL_OIL | Status: DC | PRN

## 2020-02-13 MED ORDER — MORPHINE SULFATE (PF) 0.5 MG/ML IJ SOLN
INTRAMUSCULAR | Status: AC
  Filled 2020-02-13: qty 10

## 2020-02-13 MED ORDER — DIPHENHYDRAMINE HCL 50 MG/ML IJ SOLN
12.5000 mg | INTRAMUSCULAR | Status: DC | PRN
  Administered 2020-02-13: 12.5 mg via INTRAVENOUS

## 2020-02-13 SURGICAL SUPPLY — 36 items
BENZOIN TINCTURE PRP APPL 2/3 (GAUZE/BANDAGES/DRESSINGS) ×3 IMPLANT
CLAMP CORD UMBIL (MISCELLANEOUS) IMPLANT
CLOSURE WOUND 1/2 X4 (GAUZE/BANDAGES/DRESSINGS) ×1
CLOTH BEACON ORANGE TIMEOUT ST (SAFETY) ×3 IMPLANT
DERMABOND ADVANCED (GAUZE/BANDAGES/DRESSINGS)
DERMABOND ADVANCED .7 DNX12 (GAUZE/BANDAGES/DRESSINGS) IMPLANT
DRSG OPSITE POSTOP 4X10 (GAUZE/BANDAGES/DRESSINGS) ×3 IMPLANT
ELECT REM PT RETURN 9FT ADLT (ELECTROSURGICAL) ×3
ELECTRODE REM PT RTRN 9FT ADLT (ELECTROSURGICAL) ×1 IMPLANT
EXTRACTOR VACUUM M CUP 4 TUBE (SUCTIONS) IMPLANT
EXTRACTOR VACUUM M CUP 4' TUBE (SUCTIONS)
GLOVE BIO SURGEON STRL SZ7.5 (GLOVE) ×3 IMPLANT
GLOVE BIOGEL PI IND STRL 7.0 (GLOVE) ×1 IMPLANT
GLOVE BIOGEL PI INDICATOR 7.0 (GLOVE) ×2
GOWN STRL REUS W/TWL LRG LVL3 (GOWN DISPOSABLE) ×6 IMPLANT
KIT ABG SYR 3ML LUER SLIP (SYRINGE) ×3 IMPLANT
NEEDLE HYPO 22GX1.5 SAFETY (NEEDLE) ×3 IMPLANT
NEEDLE HYPO 25X5/8 SAFETYGLIDE (NEEDLE) ×3 IMPLANT
NS IRRIG 1000ML POUR BTL (IV SOLUTION) ×3 IMPLANT
PACK C SECTION WH (CUSTOM PROCEDURE TRAY) ×3 IMPLANT
PAD ABD 7.5X8 STRL (GAUZE/BANDAGES/DRESSINGS) ×3 IMPLANT
PAD OB MATERNITY 4.3X12.25 (PERSONAL CARE ITEMS) ×3 IMPLANT
PENCIL SMOKE EVAC W/HOLSTER (ELECTROSURGICAL) ×3 IMPLANT
SPONGE GAUZE 4X4 12PLY STER LF (GAUZE/BANDAGES/DRESSINGS) ×6 IMPLANT
STRIP CLOSURE SKIN 1/2X4 (GAUZE/BANDAGES/DRESSINGS) ×2 IMPLANT
SUT MNCRL 0 VIOLET CTX 36 (SUTURE) ×4 IMPLANT
SUT MONOCRYL 0 CTX 36 (SUTURE) ×8
SUT PDS AB 0 CTX 60 (SUTURE) ×3 IMPLANT
SUT PLAIN 0 NONE (SUTURE) IMPLANT
SUT PLAIN 2 0 (SUTURE) ×2
SUT PLAIN 2 0 XLH (SUTURE) ×3 IMPLANT
SUT PLAIN ABS 2-0 CT1 27XMFL (SUTURE) ×1 IMPLANT
SUT VIC AB 4-0 KS 27 (SUTURE) ×3 IMPLANT
TOWEL OR 17X24 6PK STRL BLUE (TOWEL DISPOSABLE) ×3 IMPLANT
TRAY FOLEY W/BAG SLVR 14FR LF (SET/KITS/TRAYS/PACK) ×3 IMPLANT
WATER STERILE IRR 1000ML POUR (IV SOLUTION) ×3 IMPLANT

## 2020-02-13 NOTE — Anesthesia Postprocedure Evaluation (Signed)
Anesthesia Post Note  Patient: Kimberly [name]  Procedure(s) Performed: CESAREAN SECTION WITH BILATERAL TUBAL LIGATION (Bilateral )     Patient location during evaluation: PACU Anesthesia Type: Spinal Level of consciousness: oriented and awake and alert Pain management: pain level controlled Vital Signs Assessment: post-procedure vital signs reviewed and stable Respiratory status: spontaneous breathing, respiratory function stable and nonlabored ventilation Cardiovascular status: blood pressure returned to baseline and stable Postop Assessment: no headache, no backache, no apparent nausea or vomiting, spinal receding and patient able to bend at knees Anesthetic complications: no   No complications documented.  Last Vitals:  Vitals:   02/13/20 1315 02/13/20 1326  BP: 107/63 115/70  Pulse: (!) 52 (!) 41  Resp: 12 15  Temp: 36.5 C (!) 36.4 C  SpO2: 97% 98%    Last Pain:  Vitals:   02/13/20 1338  TempSrc:   PainSc: 0-No pain   Pain Goal:    LLE Motor Response: Purposeful movement (02/13/20 1315)   RLE Motor Response: Purposeful movement (02/13/20 1315)       Epidural/Spinal Function Cutaneous sensation: Able to Discern Pressure (02/13/20 1315), Patient able to flex knees: Yes (02/13/20 1315), Patient able to lift hips off bed: Yes (02/13/20 1315), Back pain beyond tenderness at insertion site: No (02/13/20 1315), Progressively worsening motor and/or sensory loss: No (02/13/20 1315), Bowel and/or bladder incontinence post epidural: No (02/13/20 1315)  Patrecia Veiga A.

## 2020-02-13 NOTE — Brief Op Note (Signed)
02/13/2020  12:13 PM  PATIENT:  Kimberly [name]  44 y.o. female  PRE-OPERATIVE DIAGNOSIS:  PREVIOUS x 1, DESIRES STERILITY  POST-OPERATIVE DIAGNOSIS:  PREVIOUS x 1, DESIRES STERILITY  PROCEDURE:  Procedure(s) with comments: CESAREAN SECTION WITH BILATERAL TUBAL LIGATION (Bilateral) - REPEAT EDC 02/19/20 NEED RNFA  SURGEON:  Surgeon(s) and Role:    * Harold Hedge, MD - Primary  PHYSICIAN ASSISTANT:   ASSISTANTS: none   ANESTHESIA:   spinal  EBL:  0 mL   BLOOD ADMINISTERED:none  DRAINS: Urinary Catheter (Foley)   LOCAL MEDICATIONS USED:  NONE  SPECIMEN:  Source of Specimen:  Placenta  DISPOSITION OF SPECIMEN:  PATHOLOGY  COUNTS:  YES  TOURNIQUET:  * No tourniquets in log *  DICTATION: .Other Dictation: Dictation Number [id number]  PLAN OF CARE: Admit to inpatient   PATIENT DISPOSITION:  PACU - hemodynamically stable.   Delay start of Pharmacological VTE agent (>24hrs) due to surgical blood loss or risk of bleeding: not applicable

## 2020-02-13 NOTE — Op Note (Signed)
NAME: Kimberly [name], Kimberly [name] MEDICAL RECORD [id number] ACCOUNT [id number] DATE OF BIRTH:[birth year] FACILITY: [facility] LOCATION: [facility] PHYSICIAN:Rhett Najera E. Amias Hutchinson II, MD  OPERATIVE REPORT  DATE OF PROCEDURE:  02/13/2020  PREOPERATIVE DIAGNOSES: 1.  Previous cesarean section, desires repeat. 2.  Desires permanent sterilization.  POSTOPERATIVE DIAGNOSES: 1.  Previous cesarean section, desires repeat. 2.  Desires permanent sterilization.  PROCEDURE:  Repeat cesarean section with bilateral tubal ligation.  SURGEON:  Harold Hedge, MD   ANESTHESIA:  Spinal, Mal Amabile, MD  ESTIMATED BLOOD LOSS:  Per anesthesia note.  SPECIMENS:  Placenta to pathology.  FINDINGS:  Viable female infant.  Apgars, arterial cord pH, birth weight pending.  INDICATIONS AND CONSENT:  This patient is a 44 year old patient with history of a previous cesarean section.  Prenatal care has been complicated by advanced maternal age.  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  Consultation with  maternal fetal medicine was undertaken and amniocentesis confirmed this.  Fetal echo was normal.  She desires repeat cesarean section and permanent sterilization.  Potential risks and complications were discussed preoperatively, including but not limited  to infection, organ damage, bleeding requiring transfusion of blood products with HIV and hepatitis acquisition, DVT, PE and pneumonia.  Permanence of sterilization, failure rate and increased ectopic risk also reviewed.  The patient states she  understands and agrees and consent was signed on the chart.  DESCRIPTION OF PROCEDURE:  The patient was taken to the operating room where she was identified and spinal anesthetic was placed per Dr. Malen Gauze.  She was placed in the dorsal supine position with a 15-degree left lateral wedge.  She was prepped vaginally  with Betadine.  Foley catheter was placed and prepped abdominally with ChloraPrep.  After a  3-minute drying time, she was draped in sterile fashion.  Timeout was undertaken.  After testing for adequate spinal anesthesia, skin was entered through a  Pfannenstiel incision through the previous scar.  Dissection was carried out in layers to the peritoneum, which was taken down superiorly and inferiorly.  Bladder flap was developed.  Bladder blade was placed.  Uterus was incised in a low transverse  manner and the uterine cavity was entered bluntly with a hemostat for clear fluid.  The incision was extended with the fingers.  Baby was delivered from the vertex position without difficulty.  Good cry and tone was noted and after 1 minute, the cord was  clamped and cut and the baby was handed to waiting pediatrics team.  Placenta was manually delivered and sent to pathology.  Cavity was clean.  Uterus was closed in 2 running locking imbricating layers of 0 Monocryl suture, which achieved good  hemostasis.  Both ovaries were normal.  The left fallopian tube was identified from cornu to fimbria and grasped in its mid ampullary portion with a Babcock clamp.  A knuckle of tissue was doubly ligated with 2 free ties of plain suture.  The intervening  knuckle was then sharply resected and cautery was used to assure hemostasis.  Similar procedure was carried out on the right side.  Lavage was carried out and good hemostasis was noted.  Anterior peritoneum was closed in a running fashion with 0  Monocryl suture, which was also used to reapproximate the pyramidalis muscle in the midline.  Anterior rectus fascia was closed in a running fashion with a 0 looped PDS.  Subcutaneous layer was closed with interrupted plain and the skin was closed in a  subcuticular fashion with 4-0 Vicryl on a Mellody Dance  needle.  Benzoin, Steri-Strips, honeycomb and pressure dressing was applied.  All counts were correct.  The patient was taken to the recovery room in stable condition.  VN/NUANCE  D:02/13/2020 T:02/13/2020 JOB:[id number]

## 2020-02-13 NOTE — Anesthesia Procedure Notes (Signed)
Spinal  Patient location during procedure: OR Start time: 02/13/2020 11:13 AM End time: 02/13/2020 11:15 AM Staffing Performed: anesthesiologist  Anesthesiologist: Mal Amabile, MD Preanesthetic Checklist Completed: patient identified, IV checked, site marked, risks and benefits discussed, surgical consent, monitors and equipment checked, pre-op evaluation and timeout performed Spinal Block Patient position: sitting Prep: DuraPrep and site prepped and draped Patient monitoring: heart rate, cardiac monitor, continuous pulse ox and blood pressure Approach: midline Location: L3-4 Injection technique: single-shot Needle Needle type: Pencan  Needle gauge: 24 G Needle length: 9 cm Needle insertion depth: 5 cm Assessment Sensory level: T4 Additional Notes Patient tolerated procedure well. Adequate sensory level.

## 2020-02-13 NOTE — Progress Notes (Signed)
No changes to H&P per patient history Reviewed procedure - repeat cesarean section and bilateral tubal ligation All questions answered All>codeine>N/V Patient states she understands and agrees

## 2020-02-13 NOTE — Transfer of Care (Signed)
Immediate Anesthesia Transfer of Care Note  Patient: Kimberly [name]  Procedure(s) Performed: CESAREAN SECTION WITH BILATERAL TUBAL LIGATION (Bilateral )  Patient Location: PACU  Anesthesia Type:Spinal  Level of Consciousness: awake, alert  and oriented  Airway & Oxygen Therapy: Patient Spontanous Breathing  Post-op Assessment: Report given to RN and Post -op Vital signs reviewed and stable  Post vital signs: Reviewed and stable  Last Vitals:  Vitals Value Taken Time  BP    Temp    Pulse 64 02/13/20 1215  Resp 14 02/13/20 1215  SpO2 92 % 02/13/20 1215  Vitals shown include unvalidated device data.  Last Pain:  Vitals:   02/13/20 0945  TempSrc: Oral         Complications: No complications documented.

## 2020-02-13 NOTE — Progress Notes (Signed)
Notified Grewal MD about HR in 40's and pt c/o dizziness one time.  LR @125  was initiated shortly after.  MD ordered one time bolus LR and then RN to obtain orthostatic VS.  CAll MD if dizziness continues or if VS not stable.   Also, order for IV Toradol 30mg  q6hr entered. Pt c/o incision pain 4/10.

## 2020-02-13 NOTE — Progress Notes (Signed)
Orthostatic VS completed and all WNL.  Pt denies dizziness/lightheaded while repositioning.  She does c/o increased pain at incision site.  Had IV toradol 30mg  prior to getting OOB. Will be due for Tylenol soon and would like to have IV Dilaudid (if pain does not improve) because the Oxycodone makes her nauseated and needs Zofran along with this med.

## 2020-02-14 LAB — CBC
HCT: 32 % — ABNORMAL LOW (ref 36.0–46.0)
Hemoglobin: 10.5 g/dL — ABNORMAL LOW (ref 12.0–15.0)
MCH: 33.4 pg (ref 26.0–34.0)
MCHC: 32.8 g/dL (ref 30.0–36.0)
MCV: 101.9 fL — ABNORMAL HIGH (ref 80.0–100.0)
Platelets: 131 10*3/uL — ABNORMAL LOW (ref 150–400)
RBC: 3.14 MIL/uL — ABNORMAL LOW (ref 3.87–5.11)
RDW: 12.9 % (ref 11.5–15.5)
WBC: 10.9 10*3/uL — ABNORMAL HIGH (ref 4.0–10.5)
nRBC: 0 % (ref 0.0–0.2)

## 2020-02-14 MED ORDER — ONDANSETRON HCL 4 MG PO TABS
8.0000 mg | ORAL_TABLET | Freq: Three times a day (TID) | ORAL | Status: DC | PRN
  Administered 2020-02-14: 8 mg via ORAL
  Filled 2020-02-14: qty 2

## 2020-02-14 MED ORDER — TRAMADOL HCL 50 MG PO TABS
50.0000 mg | ORAL_TABLET | Freq: Four times a day (QID) | ORAL | Status: DC
  Administered 2020-02-14 – 2020-02-15 (×4): 50 mg via ORAL
  Filled 2020-02-14 (×4): qty 1

## 2020-02-14 NOTE — Progress Notes (Signed)
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  CSW met with MOB to offer support and complete assessment.    CSW congratulated MOB and FOB on the birth of infant. CSW advised MOB of CSW's role and the reason for CSW coming to visit with her. 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 MOB reports that she had feelings of fear that related to her leaving home. MOB reported that this l;ast for about 6 weeks "and then decreased after my 6 week check up". CSW was advised that MOB never needed medication or therapy to aide in the treatment of PPA. MOB reported that she had no anxiety during this pregnancy and reported no SI or HI to this CSW.CSW provided education regarding the baby blues period vs. perinatal mood disorders, discussed treatment and gave resources for mental health follow up if concerns arise.  CSW recommends self-evaluation during the postpartum time period using the New Mom Checklist from Postpartum Progress and encouraged MOB to contact a medical professional if symptoms are noted at any time.    CSW provided review of Sudden Infant Death Syndrome (SIDS) precautions.  MOB reported that infant will sleep in crib once arrived home. MOB expressed that she has all needed items to care for infant.    CSW identifies no further need for intervention and no barriers to discharge at this time.   Virgie Dad Teigan Manner, MSW, LCSW [facility] [phone]

## 2020-02-14 NOTE — Progress Notes (Signed)
Subjective: Postpartum Day 1: Cesarean Delivery Patient reports tolerating PO, + flatus and no problems voiding.    Objective: Vital signs in last 24 hours: Temp:  [97.4 F (36.3 C)-98.2 F (36.8 C)] 97.8 F (36.6 C) (08/12 0609) Pulse Rate:  [41-64] 56 (08/12 0609) Resp:  [12-20] 18 (08/12 0609) BP: (95-121)/(58-90) 99/59 (08/12 0609) SpO2:  [92 %-99 %] 97 % (08/12 0609)  Physical Exam:  General: alert, cooperative and no distress Lochia: appropriate Uterine Fundus: firm Incision: healing well DVT Evaluation: No evidence of DVT seen on physical exam.  Recent Labs    02/11/20 0849  HGB 12.5  HCT 37.4    Assessment/Plan: Status post Cesarean section. Doing well postoperatively.  Continue current care.  Roselle Locus II 02/14/2020, 6:37 AM

## 2020-02-15 LAB — SURGICAL PATHOLOGY

## 2020-02-15 MED ORDER — IBUPROFEN 600 MG PO TABS: 600.0000 mg | ORAL_TABLET | Freq: Four times a day (QID) | ORAL | 1 refills | Status: DC | PRN

## 2020-02-15 MED ORDER — TRAMADOL HCL 50 MG PO TABS: 50.0000 mg | ORAL_TABLET | Freq: Four times a day (QID) | ORAL | 0 refills | Status: DC | PRN

## 2020-02-15 MED ORDER — ONDANSETRON 8 MG PO TBDP
8.0000 mg | ORAL_TABLET | Freq: Three times a day (TID) | ORAL | 0 refills | Status: DC | PRN
Start: 2020-02-15 — End: 2020-02-21

## 2020-02-15 NOTE — Discharge Summary (Signed)
Postpartum Discharge Summary       Patient Name: Kimberly [name] DOB: [birth year] MRN: [id number]  Date of admission: 02/13/2020 Delivery date:02/13/2020  Delivering provider: Everlene Farrier  Date of discharge: 02/15/2020  Admitting diagnosis: History of cesarean section [Z98.891] Intrauterine pregnancy: [redacted]w[redacted]d    Secondary diagnosis:  Active Problems:   History of cesarean section  Additional problems: AMA    Discharge diagnosis: Term Pregnancy Delivered                                              Post partum procedures:  Augmentation: N/A Complications: None  Hospital course: Sceduled C/S   44y.o. yo G2P2002 at 363w1das admitted to the hospital 02/13/2020 for scheduled cesarean section with the following indication:Elective Repeat.Delivery details are as follows:  Membrane Rupture Time/Date: 11:32 AM ,02/13/2020   Delivery Method:C-Section, Low Transverse and BTL Details of operation can be found in separate operative note.  Patient had an uncomplicated postpartum course.  She is ambulating, tolerating a regular diet, passing flatus, and urinating well. Patient is discharged home in stable condition on  02/15/20        Newborn Data: Birth date:02/13/2020  Birth time:11:33 AM  Gender:Female  Living status:Living  Apgars:9 ,9  Weight:3020 g     Magnesium Sulfate received: No BMZ received: No Rhophylac:N/A MMR:N/A T-DaP:Given prenatally Flu: N/A Transfusion:No  Physical exam  Vitals:   02/14/20 0846 02/14/20 1355 02/14/20 2150 02/15/20 0515  BP:  105/65 (!) 106/57 113/81  Pulse:  (!) 59 69 61  Resp:  _0 Temp:  98.3 F (36.8 C) 97.9 F (36.6 C)   TempSrc:  Axillary Oral   SpO2:  98% 98%   Weight: 73.9 kg     Height: _1  (1.6 m)      General: alert, cooperative and no distress Lochia: appropriate Uterine Fundus: firm Incision: Healing well with no significant drainage DVT Evaluation: No evidence of DVT seen on physical exam. Labs: Lab  Results  Component Value Date   WBC 10.9 (H) 02/14/2020   HGB 10.5 (L) 02/14/2020   HCT 32.0 (L) 02/14/2020   MCV 101.9 (H) 02/14/2020   PLT 131 (L) 02/14/2020   CMP Latest Ref Rng & Units 11/23/2016  Glucose 65 - 99 mg/dL 99  BUN 6 - 20 mg/dL 7  Creatinine 0.57 - 1.00 mg/dL 0.75  Sodium 134 - 144 mmol/L 141  Potassium 3.5 - 5.2 mmol/L 4.8  Chloride 96 - 106 mmol/L 102  CO2 18 - 29 mmol/L 27  Calcium 8.7 - 10.2 mg/dL 9.8  Total Protein 6.5 - 8.1 g/dL -  Total Bilirubin 0.3 - 1.2 mg/dL -  Alkaline Phos 38 - 126 U/L -  AST 15 - 41 U/L -  ALT 14 - 54 U/L -   Edinburgh Score: Edinburgh Postnatal Depression Scale Screening Tool 02/13/2020  I have been able to laugh and see the funny side of things. 0  I have looked forward with enjoyment to things. 0  I have blamed myself unnecessarily when things went wrong. 0  I have been anxious or worried for no good reason. 1  I have felt scared or panicky for no good reason. 1  Things have been getting on top of me. 0  I have been so unhappy that I have had difficulty sleeping. 0  I have felt sad or miserable. 0  I have been so unhappy that I have been crying. 1  The thought of harming myself has occurred to me. 0  Edinburgh Postnatal Depression Scale Total 3      After visit meds:  Allergies as of 02/15/2020      Reactions   Diamox [acetazolamide] Other (See Comments)   Numbness in lips, arms and hands   Hydrocodone Nausea And Vomiting   Multihance [gadobenate] Hives   Pt had hives, no itching, took benadryl, pt continued to have hives for 12 hours, pt to call back if anything changes, per dr. [place] Crane pt needs premed if contrast in the future   Scopolamine Other (See Comments)   Hallucinations      Medication List    STOP taking these medications   calcium carbonate 500 MG chewable tablet Commonly known as: TUMS - dosed in mg elemental calcium     TAKE these medications   acetaminophen 325 MG tablet Commonly known as:  TYLENOL Take 650 mg by mouth every 6 (six) hours as needed.   ibuprofen 600 MG tablet Commonly known as: ADVIL Take 1 tablet (600 mg total) by mouth every 6 (six) hours as needed for moderate pain.   ondansetron 8 MG disintegrating tablet Commonly known as: Zofran ODT Take 1 tablet (8 mg total) by mouth every 8 (eight) hours as needed for nausea or vomiting.   prenatal multivitamin Tabs tablet Take 1 tablet by mouth daily at 12 noon.   traMADol 50 MG tablet Commonly known as: ULTRAM Take 1 tablet (50 mg total) by mouth every 6 (six) hours as needed.        Discharge home in stable condition Infant Feeding: Breast Infant Disposition:home with mother Discharge instruction: per After Visit Summary and Postpartum booklet. Activity: Advance as tolerated. Pelvic rest for 6 weeks.  Diet: routine diet Anticipated Birth Control: BTL done PP Postpartum Appointment:6 weeks Additional Postpartum F/U:   Future Appointments:No future appointments. Follow up Visit:      02/15/2020 Luz Lex, MD

## 2020-02-21 ENCOUNTER — Other Ambulatory Visit: Payer: Self-pay

## 2020-02-21 ENCOUNTER — Encounter (HOSPITAL_COMMUNITY): Payer: Self-pay | Admitting: Obstetrics and Gynecology

## 2020-02-21 ENCOUNTER — Inpatient Hospital Stay (HOSPITAL_COMMUNITY)
Admission: AD | Admit: 2020-02-21 | Discharge: 2020-02-21 | Disposition: A | Discharge status: Home / Self Care | Payer: BC Managed Care – PPO | Source: Home / Self Care | Attending: Obstetrics and Gynecology | Admitting: Obstetrics and Gynecology

## 2020-02-21 DIAGNOSIS — N939 Abnormal uterine and vaginal bleeding, unspecified: Secondary | ICD-10-CM

## 2020-02-21 LAB — CBC
HCT: 46.6 % — ABNORMAL HIGH (ref 36.0–46.0)
Hemoglobin: 15.5 g/dL — ABNORMAL HIGH (ref 12.0–15.0)
MCH: 34 pg (ref 26.0–34.0)
MCHC: 33.3 g/dL (ref 30.0–36.0)
MCV: 102.2 fL — ABNORMAL HIGH (ref 80.0–100.0)
Platelets: 248 10*3/uL (ref 150–400)
RBC: 4.56 MIL/uL (ref 3.87–5.11)
RDW: 12.5 % (ref 11.5–15.5)
WBC: 9 10*3/uL (ref 4.0–10.5)
nRBC: 0 % (ref 0.0–0.2)

## 2020-02-21 LAB — URINALYSIS, ROUTINE W REFLEX MICROSCOPIC
Bilirubin Urine: NEGATIVE
Glucose, UA: NEGATIVE mg/dL
Ketones, ur: NEGATIVE mg/dL
Leukocytes,Ua: NEGATIVE
Nitrite: NEGATIVE
Protein, ur: NEGATIVE mg/dL
Specific Gravity, Urine: 1.006 (ref 1.005–1.030)
pH: 7 (ref 5.0–8.0)

## 2020-02-21 NOTE — MAU Note (Signed)
Pt presents to MAU with vaginal bleeding that started today. She delivered on 02/13/2020 via c-section. She describes bleeding as moderate with nickel sized clots. Pt denies pain.

## 2020-02-21 NOTE — MAU Provider Note (Signed)
Patient Kimberly [name] is a 44 y.o. [id number]  s/p rLTCS (scheduled) 8 days ago. She denies dizziness, SOB, cramping, foul odor, dysuria. Her complaint is an episode of "gushing" blood this morning around 6:30 am. This happened when she was putting her daughter back in the crib. She felt blood running into her pad; she went to the bathroom and it was full. There were clots in the toilet as well and on the pad (nickle sized).   She denies history of anemia, low platelets.  History     CSN: [id number]  Arrival date and time: 02/21/20 0810   None     Chief Complaint  Patient presents with  . Vaginal Bleeding   Vaginal Bleeding The patient's primary symptoms include vaginal bleeding. This is a new problem. The current episode started today. The problem has been resolved. The patient is experiencing no pain. Associated symptoms include back pain. Associated symptoms comments: Due to sciatica. The vaginal discharge was bloody. Vaginal bleeding amount: "heavier than a period" She has been passing clots (nickle sized clot).    OB History    Gravida  2   Para  2   Term  2   Preterm      AB      Living  2     SAB      TAB      Ectopic      Multiple  0   Live Births  2           Past Medical History:  Diagnosis Date  . AMA (advanced maternal age) primigravida 35+   . 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 Complication of anesthesia    pre pt had problems with anesthesia in her CS  . Gestational diabetes   . Headache   . History of endometriosis   . History of kidney stones   . Nasal congestion 07/16/2011  . Sinus infection 07/16/2011   will finish antibiotic 07/18/2011    Past Surgical History:  Procedure Laterality Date  . ablation of endometriosis  2016  . CESAREAN SECTION N/A 08/02/2016   Procedure: CESAREAN SECTION;  Surgeon: Harold Hedge, MD;  Location: [facility];  Service: Obstetrics;  Laterality: N/A;  . CESAREAN SECTION WITH BILATERAL  TUBAL LIGATION Bilateral 02/13/2020   Procedure: CESAREAN SECTION WITH BILATERAL TUBAL LIGATION;  Surgeon: Harold Hedge, MD;  Location: [facility] LD ORS;  Service: Obstetrics;  Laterality: Bilateral;  REPEAT EDC 02/19/20 NEED RNFA  . KNEE ARTHROSCOPY  07/22/2011   Procedure: ARTHROSCOPY KNEE;  Surgeon: Loreta Ave, MD;  Location: [facility];  Service: Orthopedics;  Laterality: Right;  with lateral release and with debridement/shaving (chondroplasty)  . SKIN GRAFT SPLIT THICKNESS LEG / FOOT  2008   right foot    Family History  Problem Relation Age of Onset  . Heart disease Father   . Diabetes Maternal Grandmother   . Thyroid disease Maternal Grandmother   . Stroke Maternal Grandmother   . Lung cancer Maternal Grandfather   . Heart attack Paternal Grandfather     Social History   Tobacco Use  . Smoking status: Never Smoker  . Smokeless tobacco: Never Used  Vaping Use  . Vaping Use: Never used  Substance Use Topics  . Alcohol use: No    Comment: rarely  . Drug use: No    Allergies:  Allergies  Allergen Reactions  . Diamox [Acetazolamide] Other (See Comments)    Numbness in lips, arms and  hands  . Hydrocodone Nausea And Vomiting  . Multihance [Gadobenate] Hives    Pt had hives, no itching, took benadryl, pt continued to have hives for 12 hours, pt to call back if anything changes, per dr. Margo Aye pt needs premed if contrast in the future  . Scopolamine Other (See Comments)    Hallucinations     Medications Prior to Admission  Medication Sig Dispense Refill Last Dose  . acetaminophen (TYLENOL) 325 MG tablet Take 650 mg by mouth every 6 (six) hours as needed.    Past Week at Unknown time  . ibuprofen (ADVIL) 600 MG tablet Take 1 tablet (600 mg total) by mouth every 6 (six) hours as needed for moderate pain. 30 tablet 1 02/20/2020 at Unknown time  . ondansetron (ZOFRAN ODT) 8 MG disintegrating tablet Take 1 tablet (8 mg total) by mouth every 8 (eight) hours as  needed for nausea or vomiting. 20 tablet 0 Past Month at Unknown time  . Prenatal Vit-Fe Fumarate-FA (PRENATAL MULTIVITAMIN) TABS tablet Take 1 tablet by mouth daily at 12 noon.   02/20/2020 at Unknown time  . traMADol (ULTRAM) 50 MG tablet Take 1 tablet (50 mg total) by mouth every 6 (six) hours as needed. 20 tablet 0 02/20/2020 at Unknown time    Review of Systems  Constitutional: Negative.   HENT: Negative.   Respiratory: Negative.   Genitourinary: Positive for vaginal bleeding.  Musculoskeletal: Positive for back pain.  Neurological: Negative.   Psychiatric/Behavioral: Negative.    Physical Exam   Blood pressure 135/86, pulse 76, temperature 97.6 F (36.4 C), temperature source Oral, resp. rate 18, SpO2 100 %, unknown if currently breastfeeding.  Physical Exam Exam conducted with a chaperone present.  Constitutional:      Appearance: Normal appearance.  HENT:     Head: Normocephalic.  Cardiovascular:     Rate and Rhythm: Normal rate.  Genitourinary:    General: Normal vulva.     Rectum: Guaiac result negative.     Comments: NEFG, dark brown blood in the vagina, no clots, no odor. Abdominal incision is clean, dry and intact; fundus is firm, -2, non-tender  Skin:    General: Skin is warm and dry.  Neurological:     Mental Status: She is alert.     MAU Course  Procedures  MDM -Will draw CBC> CBC normal -Exam benign, no active bleeding.  -Patient had no episodes of bleeding while in MAU.  Assessment and Plan   1. Episode of heavy vaginal bleeding   -Bleeding signs and warning signs reviewed; reviewed what constitutes heavy vaginal bleeding and when to come back to MAU. Reviewed physiologic changes PP that lead to episodes of vaginal bleeding.  -Keep appt for 6 weeks.  -Partner and patient had no further questions.   Charlesetta Garibaldi Mikaelyn Arthurs 02/21/2020, 9:18 AM

## 2020-02-21 NOTE — Discharge Instructions (Signed)
Abnormal Uterine Bleeding °Abnormal uterine bleeding means bleeding more than usual from your uterus. It can include: °· Bleeding between periods. °· Bleeding after sex. °· Bleeding that is heavier than normal. °· Periods that last longer than usual. °· Bleeding after you have stopped having your period (menopause). °There are many problems that may cause this. You should see a doctor for any kind of bleeding that is not normal. Treatment depends on the cause of the bleeding. °Follow these instructions at home: °· Watch your condition for any changes. °· Do not use tampons, douche, or have sex, if your doctor tells you not to. °· Change your pads often. °· Get regular well-woman exams. Make sure they include a pelvic exam and cervical cancer screening. °· Keep all follow-up visits as told by your doctor. This is important. °Contact a doctor if: °· The bleeding lasts more than one week. °· You feel dizzy at times. °· You feel like you are going to throw up (nauseous). °· You throw up. °Get help right away if: °· You pass out. °· You have to change pads every hour. °· You have belly (abdominal) pain. °· You have a fever. °· You get sweaty. °· You get weak. °· You passing large blood clots from your vagina. °Summary °· Abnormal uterine bleeding means bleeding more than usual from your uterus. °· There are many problems that may cause this. You should see a doctor for any kind of bleeding that is not normal. °· Treatment depends on the cause of the bleeding. °This information is not intended to replace advice given to you by your health care provider. Make sure you discuss any questions you have with your health care provider. °Document Revised: 06/15/2016 Document Reviewed: 06/15/2016 °Elsevier Patient Education © 2020 [employer] ° °

## 2021-07-24 ENCOUNTER — Telehealth: Payer: Self-pay | Admitting: Cardiology

## 2021-07-24 NOTE — Telephone Encounter (Signed)
Patient c/o Palpitations:  High priority if patient c/o lightheadedness, shortness of breath, or chest pain  How long have you had palpitations/irregular HR/ Afib? Are you having the symptoms now? yes  Are you currently experiencing lightheadedness, SOB or CP? no  Do you have a history of afib (atrial fibrillation) or irregular heart rhythm? yes  Have you checked your BP or HR? (document readings if available): no  Are you experiencing any other symptoms? No  Patient is schedule for March 9 at 1215. She was added to the cancellation list, if nurse feels she needs to be schedule soon please advise

## 2021-07-24 NOTE — Telephone Encounter (Signed)
Called pt in regards to call in.  Pt reports palpitations that have occurred for the last few weeks.  Notices palpitations more at night once settled in bed.  Pt denies drinking coffee and does not drink soda QD.  Pt has a history of heavy menstrual cycles but was placed on birth control about 5-6 months ago to control bleeding.  Pt no longer has heavy cycles.  Pt does not think is anemic which could cause palpitations.  Pt reports tracking cycles to see if palpitations are r/t but has not seen a correlation.  Pt does not keep track of BP.  I advised pt to start monitoring BP to see if palpitations could be BP related.  I advised pt that at this time Dr. Curt Bears nurse does not have any earlier appointments.  Will route to RN.

## 2021-09-10 ENCOUNTER — Encounter: Payer: Self-pay | Admitting: Cardiology

## 2021-09-10 ENCOUNTER — Other Ambulatory Visit: Payer: Self-pay

## 2021-09-10 ENCOUNTER — Ambulatory Visit: Payer: BC Managed Care – PPO | Admitting: Cardiology

## 2021-09-10 ENCOUNTER — Ambulatory Visit (INDEPENDENT_AMBULATORY_CARE_PROVIDER_SITE_OTHER): Payer: BC Managed Care – PPO

## 2021-09-10 VITALS — BP 124/80 | HR 71 | Ht 63.0 in | Wt 137.0 lb

## 2021-09-10 DIAGNOSIS — I493 Ventricular premature depolarization: Secondary | ICD-10-CM

## 2021-09-10 NOTE — Progress Notes (Signed)
? ?Electrophysiology Office Note ? ? ?Date:  09/10/2021  ? ?ID:  Kimberly [name], DOB 1976-05-04, MRN [id number] ? ?PCP:  Abner Greenspan, MD  ?Primary Electrophysiologist:  Kimberly Farabee Jorja Loa, MD   ? ?No chief complaint on file. ? ?  ?History of Present Illness: ?Kimberly [name] is a 46 y.o. female who presents today for electrophysiology evaluation.    ? ?She presents today for work-up of palpitations.  She has a history of PVCs.  She had a difficult labor and delivery of a child years ago.  She was having quite a few more PVCs during that time.  She also developed severe positional headaches.  She had an epidural that went into the thecal space.  MRI showed diminished flow in the left transverse sinus as well as sigmoid sinus and jugular vein. ? ?Today, denies symptoms of palpitations, chest pain, shortness of breath, orthopnea, PND, lower extremity edema, claudication, dizziness, presyncope, syncope, bleeding, or neurologic sequela. The patient is tolerating medications without difficulties.  Unfortunately her PVCs is started again since her pregnancy.  She had them during her first pregnancy but not her second.  She feels them more often when she is laying down to sleep.  She does not notice them as much when she is active during the day. ? ? ?Past Medical History:  ?Diagnosis Date  ? AMA (advanced maternal age) primigravida 35+   ? 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 Complication of anesthesia   ? pre pt had problems with anesthesia in her CS  ? Gestational diabetes   ? Headache   ? History of endometriosis   ? History of kidney stones   ? Nasal congestion 07/16/2011  ? Sinus infection 07/16/2011  ? Kimberly [name] finish antibiotic 07/18/2011  ? ?Past Surgical History:  ?Procedure Laterality Date  ? ablation of endometriosis  2016  ? CESAREAN SECTION N/A 08/02/2016  ? Procedure: CESAREAN SECTION;  Surgeon: Kimberly Hedge, MD;  Location: [facility];  Service: Obstetrics;  Laterality: N/A;  ? CESAREAN  SECTION WITH BILATERAL TUBAL LIGATION Bilateral 02/13/2020  ? Procedure: CESAREAN SECTION WITH BILATERAL TUBAL LIGATION;  Surgeon: Kimberly Hedge, MD;  Location: [facility];  Service: Obstetrics;  Laterality: Bilateral;  REPEAT ?[facility] 02/19/20 ?NEED RNFA  ? KNEE ARTHROSCOPY  07/22/2011  ? Procedure: ARTHROSCOPY KNEE;  Surgeon: Kimberly Ave, MD;  Location: [facility];  Service: Orthopedics;  Laterality: Right;  with lateral release and with debridement/shaving (chondroplasty)  ? SKIN GRAFT SPLIT THICKNESS LEG / FOOT  2008  ? right foot  ? ? ? ?Current Outpatient Medications  ?Medication Sig Dispense Refill  ? JUNEL FE 1/20 1-20 MG-MCG tablet Take 1 tablet by mouth daily.    ? ?No current facility-administered medications for this visit.  ? ? ?Allergies:   Diamox [acetazolamide], Hydrocodone, Multihance [gadobenate], and Scopolamine  ? ?Social History:  The patient  reports that she has never smoked. She has never used smokeless tobacco. She reports that she does not drink alcohol and does not use drugs.  ? ?Family History:  The patient's family history includes Diabetes in her maternal grandmother; Heart attack in her paternal grandfather; Heart disease in her father; Lung cancer in her maternal grandfather; Stroke in her maternal grandmother; Thyroid disease in her maternal grandmother.  ? ?ROS:  Please see the history of present illness.   Otherwise, review of systems is positive for none.   All other systems are reviewed and negative.  ? ?PHYSICAL EXAM: ?VS:  BP 124/80   Pulse 71   Ht 5\' 3"  (1.6 m)   Wt 137 lb (62.1 kg)   SpO2 99%   BMI 24.27 kg/m?  , BMI Body mass index is 24.27 kg/m?. ?GEN: Well nourished, well developed, in no acute distress  ?HEENT: normal  ?Neck: no JVD, carotid bruits, or masses ?Cardiac: RRR; no murmurs, rubs, or gallops,no edema  ?Respiratory:  clear to auscultation bilaterally, normal work of breathing ?GI: soft, nontender, nondistended, + BS ?MS: no deformity or atrophy   ?Skin: warm and dry ?Neuro:  Strength and sensation are intact ?Psych: euthymic mood, full affect ? ?EKG:  EKG is ordered today. ?Personal review of the ekg ordered shows 6 sinus rhythm, PVCs ? ?Recent Labs: ?No results found for requested labs within last 8760 hours.  ? ? ?Lipid Panel  ?No results found for: CHOL, TRIG, HDL, CHOLHDL, VLDL, LDLCALC, LDLDIRECT ? ? ?Wt Readings from Last 3 Encounters:  ?09/10/21 137 lb (62.1 kg)  ?02/14/20 163 lb (73.9 kg)  ?02/04/20 165 lb (74.8 kg)  ?  ? ? ?Other studies Reviewed: ?Additional studies/ records that were reviewed today include: Holter 01/26/16 - personally reviewed ?Minimum: 67 bpm at 3:34 ?Mean: 84 bpm ?Maximum: 131 bpm at 12:39 PM ?0% atrial fibrillation ?6.22% PVCs ?0.1% APCs ?Sinus rhythm  ? ?ASSESSMENT AND PLAN: ? ?1.  PVCs: PVCs on her ECG today with likely an RVOT morphology.  She is somewhat symptomatic.  As we do not know the burden, we Kimberly [name] have her wear a 7-day monitor.  Based on her monitor, if her burden is elevated, she may be a candidate for either ablation or medication management. ? ?Current medicines are reviewed at length with the patient today.   ?The patient does not have concerns regarding her medicines.  The following changes were made today: None ? ?Labs/ tests ordered today include:  ?Orders Placed This Encounter  ?Procedures  ? LONG TERM MONITOR (3-14 DAYS)  ? EKG 12-Lead  ? ? ? ?Disposition:   FU with Kimberly [name] 6 weeks ? ?Signed, ?Kimberly [name] 01/28/16, MD  ?09/10/2021 12:55 PM    ? ?[facility] ?[address] ?[address] ?[place] ?[phone] (office) ?[phone] (fax) ?

## 2021-09-10 NOTE — Progress Notes (Unsigned)
Enrolled for [employer] to mail a ZIO XT long term holter monitor to the patients address on file.  

## 2021-09-10 NOTE — Patient Instructions (Addendum)
Medication Instructions:  ?Your physician recommends that you continue on your current medications as directed. Please refer to the Current Medication list given to you today. ? ?*If you need a refill on your cardiac medications before your next appointment, please call your pharmacy* ? ? ?Lab Work: ?None ordered ? ? ?Testing/Procedures: ?                         ? ?ZIO XT- Long Term Monitor Instructions ? ?Your physician has requested you wear a ZIO patch monitor for 7 days.  ?This is a single patch monitor. [employer] supplies one patch monitor per enrollment. Additional ?stickers are not available. Please do not apply patch if you will be having a Nuclear Stress Test,  ?Echocardiogram, Cardiac CT, MRI, or Chest Xray during the period you would be wearing the  ?monitor. The patch cannot be worn during these tests. You cannot remove and re-apply the  ?ZIO XT patch monitor.  ?Your ZIO patch monitor will be mailed 3 day USPS to your address on file. It may take 3-5 days  ?to receive your monitor after you have been enrolled.  ?Once you have received your monitor, please review the enclosed instructions. Your monitor  ?has already been registered assigning a specific monitor serial # to you. ? ?Billing and Patient Assistance Program Information ? ?We have supplied [employer] with any of your insurance information on file for billing purposes. ?[employer] offers a sliding scale Patient Assistance Program for patients that do not have  ?insurance, or whose insurance does not completely cover the cost of the ZIO monitor.  ?You must apply for the Patient Assistance Program to qualify for this discounted rate.  ?To apply, please call [employer] at [phone], select option 4, select option 2, ask to apply for  ?Patient Assistance Program. Theodore Demark will ask your household income, and how many people  ?are in your household. They will quote your out-of-pocket cost based on that information.  ?[employer] will also be able to set up a  96-month interest-free payment plan if needed. ? ?Applying the monitor ?  ?Shave hair from upper left chest.  ?Hold abrader disc by orange tab. Rub abrader in 40 strokes over the upper left chest as  ?indicated in your monitor instructions.  ?Clean area with 4 enclosed alcohol pads. Let dry.  ?Apply patch as indicated in monitor instructions. Patch will be placed under collarbone on left  ?side of chest with arrow pointing upward.  ?Rub patch adhesive wings for 2 minutes. Remove white label marked "1". Remove the white  ?label marked "2". Rub patch adhesive wings for 2 additional minutes.  ?While looking in a mirror, press and release button in center of patch. A small green light will  ?flash 3-4 times. This will be your only indicator that the monitor has been turned on.  ?Do not shower for the first 24 hours. You may shower after the first 24 hours.  ?Press the button if you feel a symptom. You will hear a small click. Record Date, Time and  ?Symptom in the Patient Logbook.  ?When you are ready to remove the patch, follow instructions on the last 2 pages of Patient  ?Logbook. Stick patch monitor onto the last page of Patient Logbook.  ?Place Patient Logbook in the blue and white box. Use locking tab on box and tape box closed  ?securely. The blue and white box has prepaid postage on it. Please place it in the  mailbox as  ?soon as possible. Your physician should have your test results approximately 7 days after the  ?monitor has been mailed back to [facility].  ?Call [facility] at [phone] if you have questions regarding  ?your ZIO XT patch monitor. Call them immediately if you see an orange light blinking on your  ?monitor.  ?If your monitor falls off in less than 4 days, contact our Monitor department at [phone].  ?If your monitor becomes loose or falls off after 4 days call [employer] at [phone] for  ?suggestions on securing your monitor ? ? ? ?Follow-Up: ?At [facility],  you and your health needs are our priority.  As part of our continuing mission to provide you with exceptional heart care, we have created designated Provider Care Teams.  These Care Teams include your primary Cardiologist (physician) and Advanced Practice Providers (APPs -  Physician Assistants and Nurse Practitioners) who all work together to provide you with the care you need, when you need it. ? ?Your next appointment:   ?6 weeks ? ?The format for your next appointment:   ?In Person ? ?Provider:   ?Loman Brooklyn, MD ? ? ? ?Thank you for choosing [facility]!! ? ? ?Dory Horn, RN ?[phone] ?

## 2021-09-12 DIAGNOSIS — I493 Ventricular premature depolarization: Secondary | ICD-10-CM

## 2021-09-16 ENCOUNTER — Encounter: Payer: Self-pay | Admitting: Cardiology

## 2021-11-02 ENCOUNTER — Encounter: Payer: Self-pay | Admitting: Cardiology

## 2021-11-02 ENCOUNTER — Ambulatory Visit: Payer: BC Managed Care – PPO | Admitting: Cardiology

## 2021-11-02 VITALS — BP 110/76 | HR 74 | Ht 63.0 in | Wt 133.4 lb

## 2021-11-02 DIAGNOSIS — I493 Ventricular premature depolarization: Secondary | ICD-10-CM | POA: Diagnosis not present

## 2021-11-02 MED ORDER — FLECAINIDE ACETATE 50 MG PO TABS: 50.0000 mg | ORAL_TABLET | Freq: Two times a day (BID) | ORAL | 3 refills | Status: DC

## 2021-11-02 NOTE — Progress Notes (Signed)
? ?Electrophysiology Office Note ? ? ?Date:  11/02/2021  ? ?ID:  Kimberly [name], DOB July 05, 1976, MRN [id number] ? ?PCP:  Marco Collie, MD  ?Primary Electrophysiologist:  Desani Sprung Meredith Leeds, MD   ? ?No chief complaint on file. ? ?  ?History of Present Illness: ?Kimberly [name] is a 46 y.o. female who presents today for electrophysiology evaluation.    ? ?She presents today for work-up of palpitations.  She has a history of PVCs.  She had a difficult labor and had delivery of her child a year ago.  She was having quite a few more PVCs during that time.  She developed severe positional headaches.  She had an epidural that went into the thecal space.  MRI showed diminished flow in the left transverse sinus as well as sigmoid sinus and jugular vein. ? ?Unfortunately, her PVCs have started again since pregnancy.  She had them during her first pregnancy and not her second.  She wore a cardiac monitor that showed a 14.3% ectopy burden. ? ?Today, denies symptoms of chest pain, shortness of breath, orthopnea, PND, lower extremity edema, claudication, dizziness, presyncope, syncope, bleeding, or neurologic sequela. The patient is tolerating medications without difficulties.  Since last being seen her symptoms have not changed.  She continues to have PVCs associated with some fatigue and palpitations. ? ? ?Past Medical History:  ?Diagnosis Date  ? AMA (advanced maternal age) primigravida 47+   ? 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 Complication of anesthesia   ? pre pt had problems with anesthesia in her CS  ? Gestational diabetes   ? Headache   ? History of endometriosis   ? History of kidney stones   ? Nasal congestion 07/16/2011  ? Sinus infection 07/16/2011  ? Takai Chiaramonte finish antibiotic 07/18/2011  ? ?Past Surgical History:  ?Procedure Laterality Date  ? ablation of endometriosis  2016  ? CESAREAN SECTION N/A 08/02/2016  ? Procedure: CESAREAN SECTION;  Surgeon: Everlene Farrier, MD;  Location: [place];   Service: Obstetrics;  Laterality: N/A;  ? CESAREAN SECTION WITH BILATERAL TUBAL LIGATION Bilateral 02/13/2020  ? Procedure: CESAREAN SECTION WITH BILATERAL TUBAL LIGATION;  Surgeon: Everlene Farrier, MD;  Location: [place] LD [facility];  Service: Obstetrics;  Laterality: Bilateral;  REPEAT ?[facility] 02/19/20 ?NEED RNFA  ? KNEE ARTHROSCOPY  07/22/2011  ? Procedure: ARTHROSCOPY KNEE;  Surgeon: Ninetta Lights, MD;  Location: [place];  Service: Orthopedics;  Laterality: Right;  with lateral release and with debridement/shaving (chondroplasty)  ? SKIN GRAFT SPLIT THICKNESS LEG / FOOT  2008  ? right foot  ? ? ? ?Current Outpatient Medications  ?Medication Sig Dispense Refill  ? flecainide (TAMBOCOR) 50 MG tablet Take 1 tablet (50 mg total) by mouth 2 (two) times daily. 60 tablet 3  ? JUNEL FE 1/20 1-20 MG-MCG tablet Take 1 tablet by mouth daily.    ? ?No current facility-administered medications for this visit.  ? ? ?Allergies:   Diamox [acetazolamide], Hydrocodone, Multihance [gadobenate], and Scopolamine  ? ?Social History:  The patient  reports that she has never smoked. She has never used smokeless tobacco. She reports that she does not drink alcohol and does not use drugs.  ? ?Family History:  The patient's family history includes Diabetes in her maternal grandmother; Heart attack in her paternal grandfather; Heart disease in her father; Lung cancer in her maternal grandfather; Stroke in her maternal grandmother; Thyroid disease in her maternal grandmother.  ? ?ROS:  Please see the history  of present illness.   Otherwise, review of systems is positive for none.   All other systems are reviewed and negative.  ? ?PHYSICAL EXAM: ?VS:  BP 110/76   Pulse 74   Ht 5\' 3"  (1.6 m)   Wt 133 lb 6.4 oz (60.5 kg)   SpO2 99%   BMI 23.63 kg/m?  , BMI Body mass index is 23.63 kg/m?. ?GEN: Well nourished, well developed, in no acute distress  ?HEENT: normal  ?Neck: no JVD, carotid bruits, or masses ?Cardiac: Irregular; no murmurs,  rubs, or gallops,no edema  ?Respiratory:  clear to auscultation bilaterally, normal work of breathing ?GI: soft, nontender, nondistended, + BS ?MS: no deformity or atrophy  ?Skin: warm and dry ?Neuro:  Strength and sensation are intact ?Psych: euthymic mood, full affect ? ?EKG:  EKG is not ordered today. ?Personal review of the ekg ordered 3*9*23 shows this rhythm, PVCs ? ?Recent Labs: ?No results found for requested labs within last 8760 hours.  ? ? ?Lipid Panel  ?No results found for: CHOL, TRIG, HDL, CHOLHDL, VLDL, LDLCALC, LDLDIRECT ? ? ?Wt Readings from Last 3 Encounters:  ?11/02/21 133 lb 6.4 oz (60.5 kg)  ?09/10/21 137 lb (62.1 kg)  ?02/14/20 163 lb (73.9 kg)  ?  ? ? ?Other studies Reviewed: ?Additional studies/ records that were reviewed today include: Holter 01/26/16 - personally reviewed ?Minimum: 67 bpm at 3:34 ?Mean: 84 bpm ?Maximum: 131 bpm at 12:39 PM ?0% atrial fibrillation ?6.22% PVCs ?0.1% APCs ?Sinus rhythm  ? ?ASSESSMENT AND PLAN: ? ?1.  PVCs: 14.3% burden noted on cardiac monitor.  She feels weak and fatigued due to her PVCs.  She would like further management.  She does develop keloids and thus would prefer to avoid ablation for now.  Due to that, we Neyda Durango start her on flecainide 50 mg twice daily.  If she has more frequent PVCs, Rosealyn Little increase to 100 mg and order a GXT at that time.  We Lex Linhares order echo to get a baseline ejection fraction. ? ?Current medicines are reviewed at length with the patient today.   ?The patient does not have concerns regarding her medicines.  The following changes were made today: Start flecainide ? ?Labs/ tests ordered today include:  ?Orders Placed This Encounter  ?Procedures  ? ECHOCARDIOGRAM COMPLETE  ? ? ? ?Disposition:   FU with Sulma Ruffino 3 months ? ?Signed, ?Azaria Stegman Meredith Leeds, MD  ?11/02/2021 10:10 AM    ? ?[facility] ?[address] ?[address] ?[place] [address] ?[phone] (office) ?[phone]) ?

## 2021-11-02 NOTE — Patient Instructions (Addendum)
Medication Instructions:  ?Your physician has recommended you make the following change in your medication:  ?START Flecainide 50 mg twice daily ? ?*If you need a refill on your cardiac medications before your next appointment, please call your pharmacy* ? ? ?Lab Work: ?None ordered ? ? ? ?Testing/Procedures: ?Your physician has requested that you have an echocardiogram. Echocardiography is a painless test that uses sound waves to create images of your heart. It provides your doctor with information about the size and shape of your heart and how well your heart?s chambers and valves are working. This procedure takes approximately one hour. There are no restrictions for this procedure. ? ? ?Follow-Up: ?At [facility], you and your health needs are our priority.  As part of our continuing mission to provide you with exceptional heart care, we have created designated Provider Care Teams.  These Care Teams include your primary Cardiologist (physician) and Advanced Practice Providers (APPs -  Physician Assistants and Nurse Practitioners) who all work together to provide you with the care you need, when you need it. ? ?Your next appointment:   ?3 month(s) ? ?The format for your next appointment:   ?In Person ? ?Provider:   ?Loman Brooklyn, MD ? ? ? ?Thank you for choosing [facility]!! ? ? ?Dory Horn, RN ?[phone] ? ? ?Other Instructions ? ?Flecainide Tablets ?What is this medication? ?FLECAINIDE (FLEK a nide) prevents and treats a fast or irregular heartbeat (arrhythmia). It is often used to treat a type of arrhythmia known as AFib (atrial fibrillation). It works by slowing down overactive electric signals in the heart, which stabilizes your heart rhythm. It belongs to a group of medications called antiarrhythmics. ?This medicine may be used for other purposes; ask your health care provider or pharmacist if you have questions. ?COMMON BRAND NAME(S): Tambocor ?What should I tell my care team before I take  this medication? ?They need to know if you have any of these conditions: ?Abnormal levels of potassium in the blood ?Heart disease including heart rhythm and heart rate problems ?Kidney or liver disease ?Recent heart attack ?An unusual or allergic reaction to flecainide, local anesthetics, other medications, foods, dyes, or preservatives ?Pregnant or trying to get pregnant ?Breast-feeding ?How should I use this medication? ?Take this medication by mouth with a glass of water. Follow the directions on the prescription label. You can take this medication with or without food. Take your doses at regular intervals. Do not take your medication more often than directed. Do not stop taking this medication suddenly. This may cause serious, heart-related side effects. If your care team wants you to stop the medication, the dose may be slowly lowered over time to avoid any side effects. ?Talk to your care team regarding the use of this medication in children. While this medication may be prescribed for children as young as 1 year of age for selected conditions, precautions do apply. ?Overdosage: If you think you have taken too much of this medicine contact a poison control center or emergency room at once. ?NOTE: This medicine is only for you. Do not share this medicine with others. ?What if I miss a dose? ?If you miss a dose, take it as soon as you can. If it is almost time for your next dose, take only that dose. Do not take double or extra doses. ?What may interact with this medication? ?Do not take this medication with any of the following: ?Amoxapine ?Arsenic trioxide ?Certain antibiotics like clarithromycin, erythromycin, gatifloxacin, gemifloxacin, levofloxacin,  moxifloxacin, sparfloxacin, or troleandomycin ?Certain antidepressants called tricyclic antidepressants like amitriptyline, imipramine, or nortriptyline ?Certain medications to control heart rhythm like disopyramide, encainide, moricizine, procainamide,  propafenone, and quinidine ?Cisapride ?Delavirdine ?Droperidol ?Haloperidol ?[place] ?Imatinib ?Levomethadyl ?Maprotiline ?Medications for malaria like chloroquine and halofantrine ?Pentamidine ?Phenothiazines like chlorpromazine, mesoridazine, prochlorperazine, thioridazine ?Pimozide ?Quinine ?Ranolazine ?Ritonavir ?Sertindole ?This medication may also interact with the following: ?Cimetidine ?Dofetilide ?Medications for angina or high blood pressure ?Medications to control heart rhythm like amiodarone and digoxin ?Ziprasidone ?This list may not describe all possible interactions. Give your health care provider a list of all the medicines, herbs, non-prescription drugs, or dietary supplements you use. Also tell them if you smoke, drink alcohol, or use illegal drugs. Some items may interact with your medicine. ?What should I watch for while using this medication? ?Visit your care team for regular checks on your progress. Because your condition and the use of this medication carries some risk, it is a good idea to carry an identification card, necklace or bracelet with details of your condition, medications, and care team. ?Check your blood pressure and pulse rate regularly. Ask your care team what your blood pressure and pulse rate should be, and when you should contact them. Your care team also may schedule regular blood tests and electrocardiograms to check your progress. ?You may get drowsy or dizzy. Do not drive, use machinery, or do anything that needs mental alertness until you know how this medication affects you. Do not stand or sit up quickly, especially if you are an older patient. This reduces the risk of dizzy or fainting spells. Alcohol can make you more dizzy, increase flushing and rapid heartbeats. Avoid alcoholic drinks. ?What side effects may I notice from receiving this medication? ?Side effects that you should report to your care team as soon as possible: ?Allergic reactions--skin rash, itching,  hives, swelling of the face, lips, tongue, or throat ?Heart failure--shortness of breath, swelling of the ankles, feet, or hands, sudden weight gain, unusual weakness or fatigue ?Heart rhythm changes--fast or irregular heartbeat, dizziness, feeling faint or lightheaded, chest pain, trouble breathing ?Liver injury--right upper belly pain, loss of appetite, nausea, light-colored stool, dark yellow or brown urine, yellowing skin or eyes, unusual weakness or fatigue ?Side effects that usually do not require medical attention (report to your care team if they continue or are bothersome): ?Blurry vision ?Constipation ?Dizziness ?Fatigue ?Headache ?Nausea ?Tremors or shaking ?This list may not describe all possible side effects. Call your doctor for medical advice about side effects. You may report side effects to FDA at 1-800-FDA-1088. ?Where should I keep my medication? ?Keep out of the reach of children and pets. ?Store at room temperature between 15 and 30 degrees C (59 and 86 degrees F). Protect from light. Keep container tightly closed. Throw away any unused medication after the expiration date. ?NOTE: This sheet is a summary. It may not cover all possible information. If you have questions about this medicine, talk to your doctor, pharmacist, or health care provider. ?? 2023 Elsevier/Gold Standard (2020-08-15 00:00:00) ? ?  ?

## 2021-11-13 ENCOUNTER — Ambulatory Visit (HOSPITAL_COMMUNITY): Payer: BC Managed Care – PPO | Attending: Cardiology

## 2021-11-13 DIAGNOSIS — I493 Ventricular premature depolarization: Secondary | ICD-10-CM | POA: Diagnosis present

## 2021-11-13 LAB — ECHOCARDIOGRAM COMPLETE
Area-P 1/2: 3.28 cm2
S' Lateral: 3 cm

## 2021-11-21 ENCOUNTER — Encounter: Payer: Self-pay | Admitting: Cardiology

## 2022-02-05 ENCOUNTER — Ambulatory Visit: Payer: BC Managed Care – PPO | Admitting: Cardiology

## 2022-02-17 ENCOUNTER — Ambulatory Visit: Payer: BC Managed Care – PPO | Admitting: Cardiology

## 2022-02-17 ENCOUNTER — Encounter: Payer: Self-pay | Admitting: Cardiology

## 2022-02-17 VITALS — BP 121/71 | HR 61 | Ht 63.0 in | Wt 135.0 lb

## 2022-02-17 DIAGNOSIS — I493 Ventricular premature depolarization: Secondary | ICD-10-CM

## 2022-02-17 NOTE — Progress Notes (Signed)
Electrophysiology Office Note   Date:  02/17/2022   ID:  Kimberly [name], DOB 12-27-1975, MRN [id number]  PCP:  Abner Greenspan, MD  Primary Electrophysiologist:  Regan Lemming, MD    No chief complaint on file.    History of Present Illness: Kimberly [name] is a 46 y.o. female who presents today for electrophysiology evaluation.     She has a history of PVCs.  She had difficult labor and delivery with her child.  She was having more PVCs at that time.  She developed severe positional headaches.  She had an MRI that showed diminished flow in the transverse sinus as well as sigmoid sinus and jugular vein.  Her PVCs worsened after pregnancy.  She wore a cardiac monitor with a 14% burden.  She is now on flecainide.  Today, denies symptoms of palpitations, chest pain, shortness of breath, orthopnea, PND, lower extremity edema, claudication, dizziness, presyncope, syncope, bleeding, or neurologic sequela. The patient is tolerating medications without difficulties.  She is currently feeling well.  Starting the flecainide she has had a significant improvement in her palpitations burden.  She is able to do all of her daily activities, without palpitations or feeling weak and fatigued.  She is overall quite happy with her care.  She is excited to get back to exercising.   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   Complication of anesthesia    pre pt had problems with anesthesia in her CS   Gestational diabetes    Headache    History of endometriosis    History of kidney stones    Nasal congestion 07/16/2011   Sinus infection 07/16/2011   Zyanya Glaza finish antibiotic 07/18/2011   Past Surgical History:  Procedure Laterality Date   ablation of endometriosis  2016   CESAREAN SECTION N/A 08/02/2016   Procedure: CESAREAN SECTION;  Surgeon: Harold Hedge, MD;  Location: [facility];   Service: Obstetrics;  Laterality: N/A;   CESAREAN SECTION WITH BILATERAL TUBAL LIGATION Bilateral 02/13/2020   Procedure: CESAREAN SECTION WITH BILATERAL TUBAL LIGATION;  Surgeon: Harold Hedge, MD;  Location: [facility] LD ORS;  Service: Obstetrics;  Laterality: Bilateral;  REPEAT EDC 02/19/20 NEED RNFA   KNEE ARTHROSCOPY  07/22/2011   Procedure: ARTHROSCOPY KNEE;  Surgeon: Loreta Ave, MD;  Location: [facility];  Service: Orthopedics;  Laterality: Right;  with lateral release and with debridement/shaving (chondroplasty)   SKIN GRAFT SPLIT THICKNESS LEG / FOOT  2008   right foot     Current Outpatient Medications  Medication Sig Dispense Refill   flecainide (TAMBOCOR) 50 MG tablet Take 1 tablet (50 mg total) by mouth 2 (two) times daily. 60 tablet 3   JUNEL FE 1/20 1-20 MG-MCG tablet Take 1 tablet by mouth daily.     No current facility-administered medications for this visit.    Allergies:   Diamox [acetazolamide], Hydrocodone, Multihance [gadobenate], and Scopolamine   Social History:  The patient  reports that she has never smoked. She has never used smokeless tobacco. She reports that she does not drink alcohol and does not use drugs.   Family History:  The patient's family history includes Diabetes in her maternal grandmother; Heart attack in her paternal grandfather; Heart disease in her father; Lung cancer in her maternal grandfather; Stroke in her maternal grandmother; Thyroid disease in her maternal grandmother.   ROS:  Please see the history  of present illness.   Otherwise, review of systems is positive for none.   All other systems are reviewed and negative.   PHYSICAL EXAM: VS:  BP 121/71   Pulse 61   Ht 5\' 3"  (1.6 m)   Wt 135 lb (61.2 kg)   SpO2 99%   BMI 23.91 kg/m  , BMI Body mass index is 23.91 kg/m. GEN: Well nourished, well developed, in no acute distress  HEENT: normal  Neck: no JVD, carotid bruits, or masses Cardiac: RRR; no murmurs, rubs, or  gallops,no edema  Respiratory:  clear to auscultation bilaterally, normal work of breathing GI: soft, nontender, nondistended, + BS MS: no deformity or atrophy  Skin: warm and dry Neuro:  Strength and sensation are intact Psych: euthymic mood, full affect  EKG:  EKG is ordered today. Personal review of the ekg ordered shows sinus rhythm, rate 61  Recent Labs: No results found for requested labs within last 365 days.    Lipid Panel  No results found for: "CHOL", "TRIG", "HDL", "CHOLHDL", "VLDL", "LDLCALC", "LDLDIRECT"   Wt Readings from Last 3 Encounters:  02/17/22 135 lb (61.2 kg)  11/02/21 133 lb 6.4 oz (60.5 kg)  09/10/21 137 lb (62.1 kg)      Other studies Reviewed: Additional studies/ records that were reviewed today include: Holter 01/26/16 - personally reviewed Minimum: 67 bpm at 3:34 Mean: 84 bpm Maximum: 131 bpm at 12:39 PM 0% atrial fibrillation 6.22% PVCs 0.1% APCs Sinus rhythm   ASSESSMENT AND PLAN:  1.  PVCs: Has a 14.3% burden.  Feels weak and fatigued due to her PVCs.  Currently on flecainide 50 mg twice daily.  High risk medication monitoring for flecainide.  She feels significantly better on her current dose of flecainide.  QRS is narrow on this dose without evidence of toxicity.  She is overall quite happy with her results on the medication.  No changes.   Current medicines are reviewed at length with the patient today.   The patient does not have concerns regarding her medicines.  The following changes were made today: None  Labs/ tests ordered today include:  Orders Placed This Encounter  Procedures   EKG 12-Lead     Disposition:   FU 6 months  Signed, Anzel Kearse 01/28/16, MD  02/17/2022 11:32 AM     [facility] [address] [place] [phone] (office) [phone] (fax)

## 2022-03-06 ENCOUNTER — Other Ambulatory Visit: Payer: Self-pay | Admitting: Cardiology

## 2022-05-15 ENCOUNTER — Emergency Department (HOSPITAL_BASED_OUTPATIENT_CLINIC_OR_DEPARTMENT_OTHER): Payer: BC Managed Care – PPO

## 2022-05-15 ENCOUNTER — Other Ambulatory Visit: Payer: Self-pay

## 2022-05-15 ENCOUNTER — Emergency Department (HOSPITAL_BASED_OUTPATIENT_CLINIC_OR_DEPARTMENT_OTHER)
Admission: EM | Admit: 2022-05-15 | Discharge: 2022-05-16 | Disposition: A | Discharge status: Home / Self Care | Payer: BC Managed Care – PPO | Attending: Emergency Medicine | Admitting: Emergency Medicine

## 2022-05-15 ENCOUNTER — Encounter (HOSPITAL_BASED_OUTPATIENT_CLINIC_OR_DEPARTMENT_OTHER): Payer: Self-pay | Admitting: Emergency Medicine

## 2022-05-15 DIAGNOSIS — W260XXA Contact with knife, initial encounter: Secondary | ICD-10-CM | POA: Diagnosis not present

## 2022-05-15 DIAGNOSIS — S6992XA Unspecified injury of left wrist, hand and finger(s), initial encounter: Secondary | ICD-10-CM | POA: Diagnosis present

## 2022-05-15 DIAGNOSIS — S61012A Laceration without foreign body of left thumb without damage to nail, initial encounter: Secondary | ICD-10-CM | POA: Insufficient documentation

## 2022-05-15 DIAGNOSIS — Z23 Encounter for immunization: Secondary | ICD-10-CM | POA: Diagnosis not present

## 2022-05-15 LAB — GROUP A STREP BY PCR: Group A Strep by PCR: NOT DETECTED

## 2022-05-15 MED ORDER — TETANUS-DIPHTH-ACELL PERTUSSIS 5-2.5-18.5 LF-MCG/0.5 IM SUSY
0.5000 mL | PREFILLED_SYRINGE | Freq: Once | INTRAMUSCULAR | Status: AC
  Administered 2022-05-15: 0.5 mL via INTRAMUSCULAR
  Filled 2022-05-15: qty 0.5

## 2022-05-15 NOTE — ED Triage Notes (Signed)
Pt arrives pov, steady gait, endorses laceration to LT thumb today when cutting strawberries. Bleeding controlled, bandage applied in triage. Pt also c/o sore throat, request strep swab

## 2022-05-15 NOTE — Discharge Instructions (Signed)
You were seen in the emergency department for your thumb injury.   We have closed your laceration(s) with dermabond. This will come off on its own, do not pull it off. Follow-up with your pcp in 7 days for wound recheck.   If the dermabond comes out before it is time for removal, that is okay. Make sure to keep the area as clean and dry as possible. You can let warm soapy warm run over the area, but do NOT scrub it.   Watch out for signs of infection, like we discussed, including: increased redness, tenderness, or drainage of pus from the area. If this happens and you have not been prescribed an antibiotic, please seek medical attention for possible infection.   You can take over the counter pain medicine like ibuprofen or tylenol as needed.

## 2022-05-15 NOTE — ED Provider Notes (Signed)
[facility] EMERGENCY DEPARTMENT Provider Note   CSN: [id number] Arrival date & time: 05/15/22  1813     History  Chief Complaint  Patient presents with   Extremity Laceration    Kimberly [name] is a 46 y.o. female.  Patient with noncontributory past medical history presents today with complaints of left thumb laceration.  She states that earlier today she was cutting strawberries with a bread knife and accidentally sliced her left thumb.  She is not anticoagulated.  Endorses minimal amount of bleeding to same.  No other injuries or complaints.  Unsure last tetanus.  The history is provided by the patient. No language interpreter was used.       Home Medications Prior to Admission medications   Medication Sig Start Date End Date Taking? Authorizing Provider  flecainide (TAMBOCOR) 50 MG tablet TAKE 1 TABLET(50 MG) BY MOUTH TWICE DAILY 03/09/22   Camnitz, Andree Coss, MD  JUNEL FE 1/20 1-20 MG-MCG tablet Take 1 tablet by mouth daily. 07/29/21   [provider]      Allergies    Diamox [acetazolamide], Hydrocodone, Multihance [gadobenate], and Scopolamine    Review of Systems   Review of Systems  Skin:  Positive for wound.  All other systems reviewed and are negative.   Physical Exam Updated Vital Signs BP (!) 126/57 (BP Location: Right Arm)   Pulse 81   Temp 98.4 F (36.9 C) (Oral)   Resp 18   Ht 5\' 3"  (1.6 m)   Wt 61.2 kg   SpO2 100%   BMI 23.91 kg/m  Physical Exam Vitals and nursing note reviewed.  Constitutional:      General: She is not in acute distress.    Appearance: Normal appearance. She is normal weight. She is not ill-appearing, toxic-appearing or diaphoretic.  HENT:     Head: Normocephalic and atraumatic.  Cardiovascular:     Rate and Rhythm: Normal rate.  Pulmonary:     Effort: Pulmonary effort is normal. No respiratory distress.  Musculoskeletal:        General: Normal range of motion.     Cervical back: Normal range  of motion.  Skin:    General: Skin is warm and dry.     Comments: Small 2 mm skin laceration noted to the distal tip of the left thumb.  No gaping wound.  No active bleeding.  No signs of foreign body.  No nailbed involvement.  ROM intact without pain.  Capillary refill less than 2 seconds.  Compartments soft.  Neurological:     General: No focal deficit present.     Mental Status: She is alert.  Psychiatric:        Mood and Affect: Mood normal.        Behavior: Behavior normal.     ED Results / Procedures / Treatments   Labs (all labs ordered are listed, but only abnormal results are displayed) Labs Reviewed  GROUP A STREP BY PCR    EKG None  Radiology DG Finger Thumb Left  Result Date: 05/15/2022 CLINICAL DATA:  laceration EXAM: LEFT THUMB 2+V COMPARISON:  None Available. FINDINGS: There is no evidence of fracture or dislocation. There is no evidence of arthropathy or other focal bone abnormality. Subcutaneus soft tissue edema of the distal first digit. No retained radiopaque foreign body. IMPRESSION: 1. No acute displaced fracture or dislocation. 2. No retained radiopaque foreign body. Electronically Signed   By: 13/05/2022 M.D.   On: 05/15/2022 21:51  Procedures .Marland KitchenLaceration Repair  Date/Time: 05/15/2022 11:52 PM  Performed by: Silva Bandy, PA-C Authorized by: Silva Bandy, PA-C   Consent:    Consent obtained:  Verbal   Consent given by:  Patient   Risks, benefits, and alternatives were discussed: yes     Risks discussed:  Infection, pain, retained foreign body, tendon damage, vascular damage, poor wound healing, poor cosmetic result, need for additional repair and nerve damage   Alternatives discussed:  No treatment, delayed treatment, observation and referral Universal protocol:    Procedure explained and questions answered to patient or proxy's satisfaction: yes     Imaging studies available: yes     Patient identity confirmed:  Verbally with  patient Anesthesia:    Anesthesia method:  None Laceration details:    Location:  Finger   Finger location:  L thumb   Length (cm):  1   Depth (mm):  1 Exploration:    Imaging obtained: x-ray     Imaging outcome: foreign body not noted   Treatment:    Amount of cleaning:  Standard   Irrigation solution:  Sterile saline   Irrigation volume:  500 ml   Irrigation method:  Pressure wash Skin repair:    Repair method:  Tissue adhesive Repair type:    Repair type:  Simple Post-procedure details:    Dressing:  Non-adherent dressing   Procedure completion:  Tolerated well, no immediate complications     Medications Ordered in ED Medications  Tdap (BOOSTRIX) injection 0.5 mL (has no administration in time range)    ED Course/ Medical Decision Making/ A&P                           Medical Decision Making Amount and/or Complexity of Data Reviewed Radiology: ordered.  Risk Prescription drug management.   Patient presents today with laceration to the left distal tip of the thumb.  She is afebrile, nontoxic-appearing, and in no acute distress with reassuring vital signs.  Physical exam reveals small laceration that is superficial and not gaping.  No nailbed involvement or signs of foreign body.  X-ray imaging obtained which was reassuring for acute findings.  I have personally reviewed and interpreted this imaging and agree with radiology interpretation.  Pressure irrigation performed. Wound explored and base of wound visualized in a bloodless field without evidence of foreign body.  Laceration occurred < 8 hours prior to repair which was well tolerated with dermabond per above procedure. Tdap updated.  Pt has no comorbidities to effect normal wound healing. Pt discharged without antibiotics.  Discussed suture home care with patient and answered questions. Pt to follow-up for wound check in 7 days; they are to return to the ED sooner for signs of infection. Pt is hemodynamically stable  with no complaints prior to dc.  Patient understanding and amenable with plan, discharged in stable condition.   Final Clinical Impression(s) / ED Diagnoses Final diagnoses:  Laceration of left thumb without foreign body without damage to nail, initial encounter    Rx / DC Orders ED Discharge Orders     None     An After Visit Summary was printed and given to the patient.     Vear Clock 05/15/22 2357    Alvira Monday, MD 05/17/22 1258

## 2022-05-16 NOTE — ED Notes (Signed)
D/c paperwork reviewed with pt, including f/u care. No questions or concerns at time of d/c. Pt ambulatory to ED exit without assistance, NAD.

## 2022-08-23 ENCOUNTER — Encounter: Payer: Self-pay | Admitting: Cardiology

## 2022-08-23 ENCOUNTER — Ambulatory Visit: Payer: BC Managed Care – PPO | Attending: Cardiology | Admitting: Cardiology

## 2022-08-23 VITALS — BP 110/68 | HR 66 | Ht 62.0 in | Wt 123.0 lb

## 2022-08-23 DIAGNOSIS — Z79899 Other long term (current) drug therapy: Secondary | ICD-10-CM

## 2022-08-23 DIAGNOSIS — I493 Ventricular premature depolarization: Secondary | ICD-10-CM | POA: Diagnosis not present

## 2022-08-23 NOTE — Patient Instructions (Signed)
Medication Instructions:  °Your physician recommends that you continue on your current medications as directed. Please refer to the Current Medication list given to you today. ° °*If you need a refill on your cardiac medications before your next appointment, please call your pharmacy* ° ° °Lab Work: °None ordered ° ° °Testing/Procedures: °None ordered ° ° °Follow-Up: °At [facility], you and your health needs are our priority.  As part of our continuing mission to provide you with exceptional heart care, we have created designated Provider Care Teams.  These Care Teams include your primary Cardiologist (physician) and Advanced Practice Providers (APPs -  Physician Assistants and Nurse Practitioners) who all work together to provide you with the care you need, when you need it. ° °Your next appointment:   °1 year(s) ° °The format for your next appointment:   °In Person ° °Provider:   °Will Camnitz, MD ° ° ° °Thank you for choosing [facility]!! ° ° °Tea Collums, RN °[phone] ° ° ° °

## 2022-08-23 NOTE — Progress Notes (Signed)
Electrophysiology Office Note   Date:  08/23/2022   ID:  Kimberly [name], DOB 11/03/1975, MRN [id number]  PCP:  Marco Collie, MD  Primary Electrophysiologist:  Constance Haw, MD    No chief complaint on file.    History of Present Illness: Kimberly [name] is a 47 y.o. female who presents today for electrophysiology evaluation.     She has a history of PVCs.  She had a difficult labor and delivery with her child and was having more PVCs around that time.  She developed severe positional headache.  She had MRI that showed diminished flow in the transverse sinus as well as sigmoid jugular vein.  Her PVCs worsened after pregnancy.  She wore a cardiac monitor with a 14% burden.  She is on flecainide.  She did have her birth control stopped which improved her headaches.  Today, denies symptoms of palpitations, chest pain, shortness of breath, orthopnea, PND, lower extremity edema, claudication, dizziness, presyncope, syncope, bleeding, or neurologic sequela. The patient is tolerating medications without difficulties.  Since stopping her birth control she has done quite well.  She continues to note no further PVCs.  She is able to do all of her daily activities without restriction.    Past Medical History:  Diagnosis Date   AMA (advanced maternal age) primigravida 35+    Chondromalacia of right patella [id number]   Complication of anesthesia    pre pt had problems with anesthesia in her CS   Gestational diabetes    Headache    History of endometriosis    History of kidney stones    Nasal congestion 07/16/2011   Sinus infection 07/16/2011   Omelia Marquart finish antibiotic 07/18/2011   Past Surgical History:  Procedure Laterality Date   ablation of endometriosis  2016   CESAREAN SECTION N/A 08/02/2016   Procedure: CESAREAN SECTION;  Surgeon: Everlene Farrier, MD;  Location: [place];  Service: Obstetrics;  Laterality: N/A;   CESAREAN SECTION WITH BILATERAL TUBAL LIGATION  Bilateral 02/13/2020   Procedure: CESAREAN SECTION WITH BILATERAL TUBAL LIGATION;  Surgeon: Everlene Farrier, MD;  Location: [place] LD ORS;  Service: Obstetrics;  Laterality: Bilateral;  REPEAT EDC 02/19/20 NEED RNFA   KNEE ARTHROSCOPY  07/22/2011   Procedure: ARTHROSCOPY KNEE;  Surgeon: Ninetta Lights, MD;  Location: [place];  Service: Orthopedics;  Laterality: Right;  with lateral release and with debridement/shaving (chondroplasty)   SKIN GRAFT SPLIT THICKNESS LEG / FOOT  2008   right foot     Current Outpatient Medications  Medication Sig Dispense Refill   flecainide (TAMBOCOR) 50 MG tablet TAKE 1 TABLET(50 MG) BY MOUTH TWICE DAILY 180 tablet 3   No current facility-administered medications for this visit.    Allergies:   Diamox [acetazolamide], Gadolinium, Hydrocodone, Multihance [gadobenate], and Scopolamine   Social History:  The patient  reports that she has never smoked. She has never used smokeless tobacco. She reports that she does not drink alcohol and does not use drugs.   Family History:  The patient's family history includes Diabetes in her maternal grandmother; Heart attack in her paternal grandfather; Heart disease in her father; Lung cancer in her maternal grandfather; Stroke in her maternal grandmother; Thyroid disease in her maternal grandmother.   ROS:  Please see the history of present illness.   Otherwise, review of systems is positive for none.   All other systems are reviewed and negative.   PHYSICAL EXAM: VS:  BP 110/68   Pulse 66  Ht 5' 2"$  (1.575 m)   Wt 123 lb (55.8 kg)   SpO2 98%   BMI 22.50 kg/m  , BMI Body mass index is 22.5 kg/m. GEN: Well nourished, well developed, in no acute distress  HEENT: normal  Neck: no JVD, carotid bruits, or masses Cardiac: RRR; no murmurs, rubs, or gallops,no edema  Respiratory:  clear to auscultation bilaterally, normal work of breathing GI: soft, nontender, nondistended, + BS MS: no deformity or atrophy   Skin: warm and dry Neuro:  Strength and sensation are intact Psych: euthymic mood, full affect  EKG:  EKG is ordered today. Personal review of the ekg ordered shows sinus rhythm   Recent Labs: No results found for requested labs within last 365 days.    Lipid Panel  No results found for: "CHOL", "TRIG", "HDL", "CHOLHDL", "VLDL", "LDLCALC", "LDLDIRECT"   Wt Readings from Last 3 Encounters:  08/23/22 123 lb (55.8 kg)  05/15/22 135 lb (61.2 kg)  02/17/22 135 lb (61.2 kg)      Other studies Reviewed: Additional studies/ records that were reviewed today include: Holter 01/26/16 - personally reviewed Minimum: 67 bpm at 3:34 Mean: 84 bpm Maximum: 131 bpm at 12:39 PM 0% atrial fibrillation 6.22% PVCs 0.1% APCs Sinus rhythm   ASSESSMENT AND PLAN:  1.  PVCs: 14.3% burden.  Weak and fatigued due to PVCs.  On flecainide 50 mg twice daily.  Feels significantly improved on her current dose of flecainide.  Riannah Stagner continue with current management.  2.  High risk medication monitoring: Currently on flecainide as above.  QRS remains narrow.  Current medicines are reviewed at length with the patient today.   The patient does not have concerns regarding her medicines.  The following changes were made today: None  Labs/ tests ordered today include:  Orders Placed This Encounter  Procedures   EKG 12-Lead     Disposition:   FU 12 months  Signed, Darion Milewski Meredith Leeds, MD  08/23/2022 8:54 AM     [facility] [address] [place] [address] [phone] (office) [phone] (fax)

## 2023-03-20 ENCOUNTER — Other Ambulatory Visit: Payer: Self-pay | Admitting: Cardiology

## 2023-08-18 ENCOUNTER — Encounter: Payer: Self-pay | Admitting: Cardiology

## 2023-08-18 ENCOUNTER — Ambulatory Visit: Payer: 59 | Attending: Cardiology | Admitting: Cardiology

## 2023-08-18 VITALS — BP 120/80 | HR 64 | Ht 62.0 in | Wt 133.0 lb

## 2023-08-18 DIAGNOSIS — I493 Ventricular premature depolarization: Secondary | ICD-10-CM

## 2023-08-18 DIAGNOSIS — Z79899 Other long term (current) drug therapy: Secondary | ICD-10-CM | POA: Diagnosis not present

## 2023-08-18 NOTE — Progress Notes (Signed)
  Electrophysiology Office Note:   Date:  08/18/2023  ID:  Kimberly [name], DOB January 11, 1976, MRN [id number]  Primary Cardiologist: None Primary Heart Failure: None Electrophysiologist: Paden Kuras Jorja Loa, MD      History of Present Illness:   Kimberly [name] is a 48 y.o. female with h/o PVCs seen today for routine electrophysiology followup.   Since last being seen in our clinic the patient reports doing overall quite well.  She has noted only rare PVCs.  She continues to be able to do all her daily activities.  She states that she has had 1-3 PVCs since she has last been seen.  She is overall happy with her control.  she denies chest pain, palpitations, dyspnea, PND, orthopnea, nausea, vomiting, dizziness, syncope, edema, weight gain, or early satiety.   Review of systems complete and found to be negative unless listed in HPI.   EP Information / Studies Reviewed:    EKG is ordered today. Personal review as below.  EKG Interpretation Date/Time:  Thursday August 18 2023 09:08:17 EST Ventricular Rate:  64 PR Interval:  156 QRS Duration:  90 QT Interval:  404 QTC Calculation: 416 R Axis:   81  Text Interpretation: Normal sinus rhythm Normal ECG No previous ECGs available Confirmed by Atalaya Zappia ([id number]) on 08/18/2023 9:18:45 AM     Risk Assessment/Calculations:              Physical Exam:   VS:  BP 120/80 (BP Location: Left Arm, Patient Position: Sitting, Cuff Size: Normal)   Pulse 64   Ht 5\' 2"  (1.575 m)   Wt 133 lb (60.3 kg)   SpO2 98%   BMI 24.33 kg/m    Wt Readings from Last 3 Encounters:  08/18/23 133 lb (60.3 kg)  08/23/22 123 lb (55.8 kg)  05/15/22 135 lb (61.2 kg)     GEN: Well nourished, well developed in no acute distress NECK: No JVD; No carotid bruits CARDIAC: Regular rate and rhythm, no murmurs, rubs, gallops RESPIRATORY:  Clear to auscultation without rales, wheezing or rhonchi  ABDOMEN: Soft, non-tender, non-distended EXTREMITIES:  No  edema; No deformity   ASSESSMENT AND PLAN:    1.  PVCs: 14.3% burden.  Currently on flecainide 50 mg twice daily.  She is having a minimal burden of PVCs.  She is happy with her control.  She understands that her dose can be increased if she has more frequent episodes.  For now, we Jakson Delpilar continue with current management.  2.  High risk medication monitoring: Currently on flecainide for PVCs.  QRS remains narrow.  Follow up with EP APP in 12 months  Signed, Ford Peddie Jorja Loa, MD

## 2023-10-02 ENCOUNTER — Other Ambulatory Visit: Payer: Self-pay | Admitting: Cardiology

## 2024-04-19 ENCOUNTER — Encounter: Payer: Self-pay | Admitting: Obstetrics and Gynecology

## 2024-04-20 ENCOUNTER — Other Ambulatory Visit: Payer: Self-pay | Admitting: Obstetrics and Gynecology

## 2024-04-20 DIAGNOSIS — N643 Galactorrhea not associated with childbirth: Secondary | ICD-10-CM

## 2024-04-20 DIAGNOSIS — N6452 Nipple discharge: Secondary | ICD-10-CM

## 2024-05-03 ENCOUNTER — Ambulatory Visit
Admission: RE | Admit: 2024-05-03 | Discharge: 2024-05-03 | Disposition: A | Source: Ambulatory Visit | Attending: Obstetrics and Gynecology | Admitting: Obstetrics and Gynecology

## 2024-05-03 DIAGNOSIS — N643 Galactorrhea not associated with childbirth: Secondary | ICD-10-CM

## 2024-05-03 DIAGNOSIS — N6452 Nipple discharge: Secondary | ICD-10-CM
